# Patient Record
Sex: Female | Born: 1990 | Race: White | Hispanic: No | Marital: Single | Smoking: Current some day smoker
Health system: Southern US, Community
[De-identification: ages and names within clinical notes are randomized; demographics above are authoritative.]

## PROBLEM LIST (undated history)

## (undated) HISTORY — PX: DENTAL SURGERY: SHX609

---
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
## 2008-02-21 ENCOUNTER — Emergency Department (HOSPITAL_COMMUNITY): Admission: EM | Admit: 2008-02-21 | Discharge: 2008-02-21 | Payer: Self-pay | Admitting: Emergency Medicine

## 2008-03-18 ENCOUNTER — Emergency Department (HOSPITAL_COMMUNITY): Admission: EM | Admit: 2008-03-18 | Discharge: 2008-03-18 | Payer: Self-pay | Admitting: Family Medicine

## 2008-04-25 LAB — HM PAP SMEAR: HM Pap smear: NORMAL

## 2008-10-07 ENCOUNTER — Inpatient Hospital Stay (HOSPITAL_COMMUNITY): Admission: AD | Admit: 2008-10-07 | Discharge: 2008-10-07 | Payer: Self-pay | Admitting: Obstetrics and Gynecology

## 2009-01-26 ENCOUNTER — Inpatient Hospital Stay (HOSPITAL_COMMUNITY): Admission: AD | Admit: 2009-01-26 | Discharge: 2009-01-29 | Payer: Self-pay | Admitting: Obstetrics and Gynecology

## 2009-01-27 ENCOUNTER — Encounter (INDEPENDENT_AMBULATORY_CARE_PROVIDER_SITE_OTHER): Payer: Self-pay | Admitting: Obstetrics and Gynecology

## 2009-02-24 ENCOUNTER — Emergency Department (HOSPITAL_COMMUNITY): Admission: EM | Admit: 2009-02-24 | Discharge: 2009-02-25 | Payer: Self-pay | Admitting: Emergency Medicine

## 2009-10-16 ENCOUNTER — Emergency Department (HOSPITAL_COMMUNITY): Admission: EM | Admit: 2009-10-16 | Discharge: 2009-10-17 | Payer: Self-pay | Admitting: Emergency Medicine

## 2009-11-19 ENCOUNTER — Emergency Department (HOSPITAL_COMMUNITY): Admission: EM | Admit: 2009-11-19 | Discharge: 2009-11-20 | Payer: Self-pay | Admitting: Emergency Medicine

## 2009-11-20 ENCOUNTER — Emergency Department (HOSPITAL_COMMUNITY): Admission: EM | Admit: 2009-11-20 | Discharge: 2009-11-20 | Payer: Self-pay | Admitting: Emergency Medicine

## 2010-09-16 LAB — POCT URINALYSIS DIP (DEVICE)
Hgb urine dipstick: NEGATIVE
Ketones, ur: NEGATIVE mg/dL
Nitrite: NEGATIVE
Specific Gravity, Urine: 1.025 (ref 1.005–1.030)

## 2010-10-05 LAB — URINALYSIS, ROUTINE W REFLEX MICROSCOPIC
Bilirubin Urine: NEGATIVE
Ketones, ur: NEGATIVE mg/dL
Specific Gravity, Urine: 1.006 (ref 1.005–1.030)

## 2010-10-05 LAB — URINE MICROSCOPIC-ADD ON

## 2010-10-05 LAB — CBC
MCV: 93.4 fL (ref 78.0–100.0)
RBC: 3.46 MIL/uL — ABNORMAL LOW (ref 3.87–5.11)

## 2010-10-05 LAB — URINE CULTURE

## 2010-10-06 LAB — CBC
HCT: 38.8 % (ref 36.0–46.0)
MCHC: 34 g/dL (ref 30.0–36.0)
MCV: 93.5 fL (ref 78.0–100.0)
Platelets: 170 10*3/uL (ref 150–400)
RBC: 4.16 MIL/uL (ref 3.87–5.11)
RDW: 13.7 % (ref 11.5–15.5)

## 2010-10-09 LAB — CBC
Platelets: 143 10*3/uL — ABNORMAL LOW (ref 150–400)
RBC: 3.33 MIL/uL — ABNORMAL LOW (ref 3.87–5.11)
WBC: 16.4 10*3/uL — ABNORMAL HIGH (ref 4.0–10.5)

## 2010-10-09 LAB — URINALYSIS, ROUTINE W REFLEX MICROSCOPIC
Glucose, UA: NEGATIVE mg/dL
Specific Gravity, Urine: 1.02 (ref 1.005–1.030)
pH: 6.5 (ref 5.0–8.0)

## 2010-10-09 LAB — WET PREP, GENITAL: Trich, Wet Prep: NONE SEEN

## 2010-10-23 ENCOUNTER — Ambulatory Visit (HOSPITAL_COMMUNITY): Payer: Self-pay
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
## 2010-12-18 ENCOUNTER — Encounter: Payer: Self-pay | Admitting: Family Medicine

## 2011-03-16 ENCOUNTER — Inpatient Hospital Stay (INDEPENDENT_AMBULATORY_CARE_PROVIDER_SITE_OTHER)
Admission: RE | Admit: 2011-03-16 | Discharge: 2011-03-16 | Disposition: A | Discharge status: Home / Self Care | Payer: Medicaid Other | Source: Ambulatory Visit | Attending: Family Medicine | Admitting: Family Medicine

## 2011-03-16 DIAGNOSIS — J310 Chronic rhinitis: Secondary | ICD-10-CM

## 2011-03-16 DIAGNOSIS — J019 Acute sinusitis, unspecified: Secondary | ICD-10-CM

## 2011-03-26 LAB — POCT URINALYSIS DIP (DEVICE)
Bilirubin Urine: NEGATIVE
Glucose, UA: NEGATIVE
Ketones, ur: NEGATIVE
Operator id: 239701
Protein, ur: NEGATIVE
pH: 7

## 2011-03-26 LAB — POCT PREGNANCY, URINE
Operator id: 247071
Preg Test, Ur: NEGATIVE

## 2011-03-26 LAB — URINE CULTURE

## 2011-03-31 LAB — POCT URINALYSIS DIP (DEVICE)
Bilirubin Urine: NEGATIVE
Hgb urine dipstick: NEGATIVE
Ketones, ur: NEGATIVE
Protein, ur: NEGATIVE
Urobilinogen, UA: 1
pH: 6.5

## 2011-03-31 LAB — POCT PREGNANCY, URINE: Preg Test, Ur: NEGATIVE

## 2012-08-17 ENCOUNTER — Encounter (HOSPITAL_COMMUNITY): Payer: Self-pay

## 2012-08-17 ENCOUNTER — Inpatient Hospital Stay (HOSPITAL_COMMUNITY): Payer: Medicaid Other

## 2012-08-17 ENCOUNTER — Inpatient Hospital Stay (HOSPITAL_COMMUNITY)
Admission: AD | Admit: 2012-08-17 | Discharge: 2012-08-17 | Disposition: A | Discharge status: Home / Self Care | Payer: Medicaid Other | Source: Ambulatory Visit | Attending: Obstetrics and Gynecology | Admitting: Obstetrics and Gynecology

## 2012-08-17 DIAGNOSIS — N938 Other specified abnormal uterine and vaginal bleeding: Secondary | ICD-10-CM

## 2012-08-17 DIAGNOSIS — N949 Unspecified condition associated with female genital organs and menstrual cycle: Secondary | ICD-10-CM | POA: Insufficient documentation

## 2012-08-17 DIAGNOSIS — N739 Female pelvic inflammatory disease, unspecified: Secondary | ICD-10-CM | POA: Insufficient documentation

## 2012-08-17 DIAGNOSIS — R42 Dizziness and giddiness: Secondary | ICD-10-CM | POA: Insufficient documentation

## 2012-08-17 LAB — URINALYSIS, ROUTINE W REFLEX MICROSCOPIC
Bilirubin Urine: NEGATIVE
Urobilinogen, UA: 0.2 mg/dL (ref 0.0–1.0)

## 2012-08-17 LAB — URINE MICROSCOPIC-ADD ON

## 2012-08-17 LAB — CBC
Hemoglobin: 13.5 g/dL (ref 12.0–15.0)
MCH: 29.9 pg (ref 26.0–34.0)
WBC: 10.8 10*3/uL — ABNORMAL HIGH (ref 4.0–10.5)

## 2012-08-17 MED ORDER — OXYCODONE-ACETAMINOPHEN 5-325 MG PO TABS
1.0000 | ORAL_TABLET | ORAL | Status: DC | PRN
Start: 2012-08-17 — End: 2012-10-05

## 2012-08-17 MED ORDER — DOXYCYCLINE HYCLATE 50 MG PO CAPS: 50.0000 mg | ORAL_CAPSULE | Freq: Two times a day (BID) | ORAL | Status: DC

## 2012-08-17 MED ORDER — SODIUM CHLORIDE 0.9 % IV SOLN
INTRAVENOUS | Status: DC
  Administered 2012-08-17: 14:00:00 via INTRAVENOUS

## 2012-08-17 MED ORDER — NALBUPHINE SYRINGE 5 MG/0.5 ML
5.0000 mg | INJECTION | Freq: Once | INTRAMUSCULAR | Status: AC
  Administered 2012-08-17: 5 mg via INTRAVENOUS
  Filled 2012-08-17: qty 0.5

## 2012-08-17 MED ORDER — MEGESTROL ACETATE 40 MG PO TABS: 40.0000 mg | ORAL_TABLET | Freq: Every day | ORAL | Status: DC

## 2012-08-17 MED ORDER — OXYCODONE-ACETAMINOPHEN 5-325 MG PO TABS: 1.0000 | ORAL_TABLET | ORAL | Status: DC | PRN

## 2012-08-17 MED ORDER — LACTATED RINGERS IV SOLN
Freq: Once | INTRAVENOUS | Status: AC
  Administered 2012-08-17: 11:00:00 via INTRAVENOUS

## 2012-08-17 MED ORDER — KETOROLAC TROMETHAMINE 30 MG/ML IJ SOLN
30.0000 mg | Freq: Once | INTRAMUSCULAR | Status: AC
  Administered 2012-08-17: 30 mg via INTRAVENOUS
  Filled 2012-08-17: qty 1

## 2012-08-17 MED ORDER — DEXTROSE 5 % IV SOLN
1.0000 g | Freq: Once | INTRAVENOUS | Status: AC
  Administered 2012-08-17: 1 g via INTRAVENOUS
  Filled 2012-08-17: qty 10

## 2012-08-17 NOTE — MAU Note (Signed)
Patient is in with c/o 4 weeks of intermittent heavier to small amount of vaginal bleeding, fatigue and dizziness. She states that she was seen at [facility] and was notified yesterday that all her lab (thyroid, blood count) was normal. No bleeding observed now, patient had tampon on her way here, she was instructed to remove it, pad given. Patient also c/o generalized abdominal/body pain.

## 2012-08-17 NOTE — MAU Provider Note (Signed)
History    CSN: [id number]  Arrival date and time: 08/17/12 0904   None    Chief Complaint  Patient presents with  . Vaginal Bleeding  . Abdominal Pain   HPI Meghan Guerrero is a 22 yo G1P1 female who presents to the MAU complaining of vaginal bleeding x 4 weeks. She reports that the bleeding started 4 weeks ago, and that the first 2 weeks she was bleeding very heavily every day, using a tampon every 1-2 hours. She reports that during these first 2 weeks she considered that she may be having a miscarriage because in previous weeks she had been sexually active and had been nauseous. She never took a pregnancy test. She reports that she decided that she was probably not having a miscarriage, and instead decided the bleeding was due to stress. She continued to bleed for another 2 weeks, although the bleeding lightened and was only occuring off and on. She describes that she has been taking OCPs for over a year and had regular periods every month that would last 3-4 days and were not very heavy. She reports her last regular period was at the beginning of January. She confirms that she has continued to take her OCPs regularly over this 4 week period of bleeding.  She also reports feeling very achy and weak during these 4 weeks. She also complains of sharp abdominal pains x 3 years that has worsened with this episode of bleeding. She has had ortho and neuro workup for this pain. She reports her GYN has not investigated her abdominal pain. She reports that she has been eating well and has been drinking a lot of [facility] to stay hydrated. She denies drinking much water over the last 4 weeks. She denies fever, vomiting, or pain with urination.   On Saturday she went to her doctor the patient reports that she was told her blood count was fine and was prescribed 800 mg ibuprofen. She reports mild relief of her pain with the ibuprofen. Patient is a current smoker and smokes .5 packs/day x 3 years.  OB  History   Grav Para Term Preterm Abortions TAB SAB Ect Mult Living   1 1 1       1       Past Medical History  Diagnosis Date  . Migraine     History reviewed. No pertinent past surgical history.  History reviewed. No pertinent family history.  History  Substance Use Topics  . Smoking status: Current Some Day Smoker    Types: Cigarettes  . Smokeless tobacco: Not on file  . Alcohol Use: Yes     Comment: occasional per patient    Allergies:  Allergies  Allergen Reactions  . Ketek (Telithromycin) Hives  . Tramadol Nausea And Vomiting    Prescriptions prior to admission  Medication Sig Dispense Refill  . drospirenone-ethinyl estradiol (VESTURA) 3-0.02 MG tablet Take 1 tablet by mouth daily.      Marland Kitchen ibuprofen (ADVIL,MOTRIN) 800 MG tablet Take 800 mg by mouth every 8 (eight) hours as needed for pain.        Review of Systems  Constitutional: Positive for malaise/fatigue. Negative for fever and chills.  Respiratory: Negative for cough and wheezing.   Cardiovascular: Negative for chest pain, palpitations and leg swelling.  Gastrointestinal: Positive for nausea and abdominal pain. Negative for vomiting, diarrhea, constipation and blood in stool.  Genitourinary: Negative for dysuria, urgency and frequency.  Musculoskeletal: Positive for back pain.  Neurological: Positive for dizziness,  weakness and headaches. Negative for loss of consciousness.    Physical Exam   Blood pressure 112/76, pulse 102, temperature 97.8 F (36.6 C), temperature source Oral, resp. rate 20, last menstrual period 07/01/2012, SpO2 100.00%.  Physical Exam Physical Examination:  General appearance - oriented to person, place, and time and in mild to moderate distress Mouth - mucous membranes moist, pharynx normal without lesions Chest - clear to auscultation, no wheezes, rales or rhonchi, symmetric air entry Heart - normal rate, regular rhythm, normal S1, S2, no murmurs, rubs, clicks or  gallops Abdomen - soft, mild pelvic tenderness, no guarding or rebound tenderness, nondistended, no masses or organomegaly Bimanual Pelvic- mild cervical motion tenderness noted on exam Extremities - peripheral pulses normal, no pedal edema, no clubbing or cyanosis Skin - normal coloration and turgor, no rashes, no suspicious skin lesions noted  MAU Course  Procedures  Results for orders placed during the hospital encounter of 08/17/12 (from the past 24 hour(s))  URINALYSIS, ROUTINE W REFLEX MICROSCOPIC     Status: Abnormal   Collection Time    08/17/12  9:10 AM      Result Value Range   Color, Urine AMBER (*) YELLOW   APPearance HAZY (*) CLEAR   Specific Gravity, Urine >1.030 (*) 1.005 - 1.030   pH 6.0  5.0 - 8.0   Glucose, UA NEGATIVE  NEGATIVE mg/dL   Hgb urine dipstick TRACE (*) NEGATIVE   Bilirubin Urine NEGATIVE  NEGATIVE   Ketones, ur 15 (*) NEGATIVE mg/dL   Protein, ur NEGATIVE  NEGATIVE mg/dL   Urobilinogen, UA 0.2  0.0 - 1.0 mg/dL   Nitrite NEGATIVE  NEGATIVE   Leukocytes, UA NEGATIVE  NEGATIVE  URINE MICROSCOPIC-ADD ON     Status: Abnormal   Collection Time    08/17/12  9:10 AM      Result Value Range   Squamous Epithelial / LPF MANY (*) RARE   WBC, UA 3-6  <3 WBC/hpf   RBC / HPF 0-2  <3 RBC/hpf   Bacteria, UA FEW (*) RARE   Urine-Other MUCOUS PRESENT    POCT PREGNANCY, URINE     Status: None   Collection Time    08/17/12  9:18 AM      Result Value Range   Preg Test, Ur NEGATIVE  NEGATIVE  CBC     Status: Abnormal   Collection Time    08/17/12  9:35 AM      Result Value Range   WBC 10.8 (*) 4.0 - 10.5 K/uL   RBC 4.52  3.87 - 5.11 MIL/uL   Hemoglobin 13.5  12.0 - 15.0 g/dL   HCT 45.4  09.8 - 11.9 %   MCV 90.5  78.0 - 100.0 fL   MCH 29.9  26.0 - 34.0 pg   MCHC 33.0  30.0 - 36.0 g/dL   RDW 14.7  82.9 - 56.2 %   Platelets 156  150 - 400 K/uL    Assessment and Plan  Assessment: Meghan Guerrero is a 22 yo female who presents with vaginal bleeding and  dizziness. Her Hgb is 13.5 and Hct 40.9 today. Minimal blood loss this morning. Urinalysis shows specific gravity of >1.030 and is positive for ketones. Patient is most likely dehydrated.  Plan:   Adela Glimpse 08/17/2012, 10:01 AM   Has not mentioned anything to me about pain until just now. Now states she has had sharp needle-like pain in her pelvis and lower back since the birth of her child. Was  sent to Ortho and Neuro by her doctor in [facility].    Ultrasound normal with 2mm endometrium. Normal ovaries Discussed with Dr Jolayne Panther. 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  WIll give 2 weeks of Megace to stabilize endometrium.  Patient requests something for pain at home.  Will give limited Rx vicodin with no refills. ?chronic pain Will refer to clinic for long term followup .

## 2012-08-17 NOTE — MAU Note (Signed)
Patient states she has had bleeding for about 4 weeks, sometime heavy then light. States she thinks she has had a miscarriage. Went to [facility] on Saturday and was evaluated and states the pregnancy test was negative. Having abdominal pain

## 2012-08-18 LAB — URINE CULTURE: Colony Count: 35000

## 2012-08-19 NOTE — MAU Provider Note (Signed)
Attestation of Attending Supervision of Advanced Practitioner (CNM/NP): Evaluation and management procedures were performed by the Advanced Practitioner under my supervision and collaboration.  I have reviewed the Advanced Practitioner's note and chart, and I agree with the management and plan.  Corneilus Heggie 08/19/2012 12:15 PM   

## 2012-10-05 ENCOUNTER — Emergency Department (INDEPENDENT_AMBULATORY_CARE_PROVIDER_SITE_OTHER)
Admission: EM | Admit: 2012-10-05 | Discharge: 2012-10-05 | Disposition: A | Discharge status: Home / Self Care | Payer: Self-pay | Source: Home / Self Care | Attending: Emergency Medicine | Admitting: Emergency Medicine

## 2012-10-05 ENCOUNTER — Encounter (HOSPITAL_COMMUNITY): Payer: Self-pay | Admitting: Emergency Medicine

## 2012-10-05 DIAGNOSIS — R05 Cough: Secondary | ICD-10-CM

## 2012-10-05 DIAGNOSIS — J029 Acute pharyngitis, unspecified: Secondary | ICD-10-CM

## 2012-10-05 DIAGNOSIS — J069 Acute upper respiratory infection, unspecified: Secondary | ICD-10-CM

## 2012-10-05 MED ORDER — GUAIFENESIN-CODEINE 100-10 MG/5ML PO SOLN: 5.0000 mL | Freq: Three times a day (TID) | ORAL | Status: DC | PRN

## 2012-10-05 MED ORDER — ALBUTEROL SULFATE HFA 108 (90 BASE) MCG/ACT IN AERS: 1.0000 | INHALATION_SPRAY | Freq: Four times a day (QID) | RESPIRATORY_TRACT | Status: DC | PRN

## 2012-10-05 MED ORDER — PREDNISONE 10 MG PO TABS: 20.0000 mg | ORAL_TABLET | Freq: Every day | ORAL | Status: AC

## 2012-10-05 NOTE — ED Provider Notes (Signed)
History     CSN: [id number]  Arrival date & time 10/05/12  1225   First MD Initiated Contact with Patient 10/05/12 1400      Chief Complaint  Patient presents with  . URI    (Consider location/radiation/quality/duration/timing/severity/associated sxs/prior treatment) HPI Comments: Patient presents urgent care describing she's been coughing having sore throat and runny nose and generalized fatigue and body aches since Sunday night. She is taking her son to the emergency department Saturday was diagnosed with streptococcal infection. She's also nauseous and had vomited a couple times. Palpable fevers have been taking ibuprofen and sinus cold medicine with no relief. Mainly describing nocturnal cough does not letter sleep and some mild wheezing.  Patient is a 22 y.o. female presenting with URI. The history is provided by the patient.  URI Presenting symptoms: congestion, cough, fever, rhinorrhea and sore throat   Presenting symptoms: no fatigue   Severity:  Moderate Onset quality:  Gradual Duration:  3 days Timing:  Constant Progression:  Worsening Chronicity:  New Relieved by:  Nothing Worsened by:  Nothing tried Associated symptoms: headaches, swollen glands and wheezing   Associated symptoms: no myalgias and no neck pain   Risk factors: sick contacts   Risk factors: no diabetes mellitus     Past Medical History  Diagnosis Date  . Migraine     History reviewed. No pertinent past surgical history.  No family history on file.  History  Substance Use Topics  . Smoking status: Current Some Day Smoker    Types: Cigarettes  . Smokeless tobacco: Not on file  . Alcohol Use: Yes     Comment: occasional per patient    OB History   Grav Para Term Preterm Abortions TAB SAB Ect Mult Living   1 1 1       1       Review of Systems  Constitutional: Positive for fever and appetite change. Negative for chills, diaphoresis and fatigue.  HENT: Positive for congestion, sore  throat and rhinorrhea. Negative for neck pain, neck stiffness and postnasal drip.   Respiratory: Positive for cough and wheezing. Negative for shortness of breath.   Musculoskeletal: Negative for myalgias and back pain.  Skin: Negative for color change.  Neurological: Positive for headaches.    Allergies  Ketek and Tramadol  Home Medications   Current Outpatient Rx  Name  Route  Sig  Dispense  Refill  . ibuprofen (ADVIL,MOTRIN) 800 MG tablet   Oral   Take 800 mg by mouth every 8 (eight) hours as needed for pain.         Marland Kitchen norethindrone-ethinyl estradiol (MICROGESTIN,JUNEL,LOESTRIN) 1-20 MG-MCG tablet   Oral   Take 1 tablet by mouth daily.           BP 119/76  Pulse 83  Temp(Src) 98.4 F (36.9 C) (Oral)  Resp 13  SpO2 100%  LMP 09/04/2012  Physical Exam  Nursing note and vitals reviewed. Constitutional: She appears well-developed and well-nourished.  Non-toxic appearance. She does not have a sickly appearance. She does not appear ill. No distress.  HENT:  Head: Normocephalic.  Right Ear: Tympanic membrane normal.  Left Ear: Tympanic membrane normal.  Nose: Rhinorrhea present. No mucosal edema, nose lacerations, sinus tenderness or nasal deformity.  Mouth/Throat: Uvula is midline. Mucous membranes are not pale, not dry and not cyanotic. Posterior oropharyngeal erythema present. No oropharyngeal exudate, posterior oropharyngeal edema or tonsillar abscesses.  Eyes: Conjunctivae are normal. Right eye exhibits no discharge. Left eye exhibits no  discharge. No scleral icterus.  Neck: Neck supple. No JVD present.  Cardiovascular: Normal rate.  Exam reveals no gallop and no friction rub.   No murmur heard. Pulmonary/Chest: Effort normal. She has no decreased breath sounds. She has no wheezes. She has no rhonchi. She has no rales. She exhibits no tenderness.  Abdominal: Soft.  Lymphadenopathy:    She has cervical adenopathy.  Neurological: She is alert.  Skin: No rash  noted. No erythema.    ED Course  Procedures (including critical care time)  Labs Reviewed  POCT RAPID STREP A ([facility] URG CARE ONLY)   No results found.   No diagnosis found.    MDM  Symptoms and exam were consistent with a viral respiratory process. Will prescribed with patient and antitussive 5 days prednisone and albuterol and she's having a mild reactive airway disease.patient instructed to return if any worsening symptoms or no improvement of her second respiratory exam. Patient agrees with treatment plan and followup care as necessary.      Jimmie Molly, MD 10/05/12 [phone]

## 2012-10-05 NOTE — ED Notes (Signed)
Pt c/o fever, coughing, sore throat, runny nose, and generalized weakness since Sunday night. Son was treated for strept throat Saturday in ED. Pt reports emesis since Sunday night last reported emesis this morning was yellow. Fever has been 100 x 2 days. Has tried taking Ibuprofen 800 mg and sinus cold and cough with no relief. Patient is alert and oriented.

## 2012-10-05 NOTE — ED Notes (Signed)
Xray student obtained vitals 

## 2012-10-06 ENCOUNTER — Telehealth (HOSPITAL_COMMUNITY): Payer: Self-pay | Admitting: *Deleted

## 2012-10-06 NOTE — ED Notes (Signed)
Pt. called on VM @ 1747 and said she was seen here yesterday by Dr. Ladon Applebaum and was given cough medicine.  She said she only has 2 doses of cough medicine left. States its not really helping her that much.  I called but her VM box is full and unable to leave a message. I left our call back number. Vassie Moselle 10/06/2012

## 2012-12-26 ENCOUNTER — Emergency Department (INDEPENDENT_AMBULATORY_CARE_PROVIDER_SITE_OTHER)
Admission: EM | Admit: 2012-12-26 | Discharge: 2012-12-26 | Disposition: A | Discharge status: Home / Self Care | Payer: Medicaid Other | Source: Home / Self Care | Attending: Emergency Medicine | Admitting: Emergency Medicine

## 2012-12-26 ENCOUNTER — Encounter (HOSPITAL_COMMUNITY): Payer: Self-pay | Admitting: Emergency Medicine

## 2012-12-26 DIAGNOSIS — R82998 Other abnormal findings in urine: Secondary | ICD-10-CM

## 2012-12-26 DIAGNOSIS — R110 Nausea: Secondary | ICD-10-CM

## 2012-12-26 DIAGNOSIS — R8271 Bacteriuria: Secondary | ICD-10-CM

## 2012-12-26 LAB — POCT I-STAT, CHEM 8
Calcium, Ion: 1.21 mmol/L (ref 1.12–1.23)
Chloride: 104 mEq/L (ref 96–112)
Glucose, Bld: 120 mg/dL — ABNORMAL HIGH (ref 70–99)
HCT: 48 % — ABNORMAL HIGH (ref 36.0–46.0)
TCO2: 24 mmol/L (ref 0–100)

## 2012-12-26 LAB — POCT URINALYSIS DIP (DEVICE)
Bilirubin Urine: NEGATIVE
Ketones, ur: NEGATIVE mg/dL
Specific Gravity, Urine: 1.015 (ref 1.005–1.030)
pH: 6 (ref 5.0–8.0)

## 2012-12-26 MED ORDER — ONDANSETRON 4 MG PO TBDP: 4.0000 mg | ORAL_TABLET | Freq: Four times a day (QID) | ORAL | Status: DC | PRN

## 2012-12-26 NOTE — ED Notes (Signed)
Pt c/o vomiting and diarrhea yesterday morning. Getting a little better but still having nausea with smell of food.  Having sinus pressure and pain. " feels like head is going to explode"  C/o dark color to urine. Denies any other urinary problems.

## 2012-12-26 NOTE — ED Provider Notes (Signed)
History    CSN: [id number] Arrival date & time 12/26/12  1311  First MD Initiated Contact with Patient 12/26/12 1504     Chief Complaint  Patient presents with  . Emesis    vomiting and diarrhea yesterday morning.   Marland Kitchen Urinary Tract Infection    dark urine   (Consider location/radiation/quality/duration/timing/severity/associated sxs/prior Treatment) HPI Comments: A 22 year old female presents complaining of nausea, vomiting, diarrhea began on Friday. Her son recently recovered from gastroenteritis and she believe she may have caught this from him. She also noted that yesterday she started to have very dark urine. The vomiting has since resolved but she still has extreme nausea that is worse with smelling any food. She does note that she very rarely drinks any water and she thinks this may have given her a urinary tract infection. She endorses the dark urine and lower back pain only. She denies hematuria, dysuria, or urinary frequency. Denies pain in her flanks. Currently, her 2 complaints are dark urine and nausea.  Patient is a 22 y.o. female presenting with vomiting and urinary tract infection.  Emesis Associated symptoms: diarrhea   Associated symptoms: no abdominal pain, no arthralgias, no chills and no myalgias   Urinary Tract Infection Pertinent negatives include no chest pain, no abdominal pain and no shortness of breath.   Past Medical History  Diagnosis Date  . Migraine    History reviewed. No pertinent past surgical history. History reviewed. No pertinent family history. History  Substance Use Topics  . Smoking status: Current Some Day Smoker    Types: Cigarettes  . Smokeless tobacco: Not on file  . Alcohol Use: Yes     Comment: occasional per patient   OB History   Grav Para Term Preterm Abortions TAB SAB Ect Mult Living   1 1 1       1      Review of Systems  Constitutional: Negative for fever and chills.  Eyes: Negative for visual disturbance.  Respiratory:  Negative for cough and shortness of breath.   Cardiovascular: Negative for chest pain, palpitations and leg swelling.  Gastrointestinal: Positive for nausea, vomiting and diarrhea. Negative for abdominal pain.  Endocrine: Negative for polydipsia and polyuria.  Genitourinary: Negative for dysuria, urgency, frequency, hematuria, flank pain, decreased urine volume, vaginal discharge, difficulty urinating and dyspareunia.       Dark urine  Musculoskeletal: Negative for myalgias and arthralgias.  Skin: Negative for rash.  Neurological: Negative for dizziness, weakness and light-headedness.    Allergies  Ketek and Tramadol  Home Medications   Current Outpatient Rx  Name  Route  Sig  Dispense  Refill  . albuterol (PROVENTIL HFA;VENTOLIN HFA) 108 (90 BASE) MCG/ACT inhaler   Inhalation   Inhale 1-2 puffs into the lungs every 6 (six) hours as needed for wheezing or shortness of breath.   1 Inhaler   0   . guaiFENesin-codeine 100-10 MG/5ML syrup   Oral   Take 5 mLs by mouth 3 (three) times daily as needed for cough.   120 mL   0   . ibuprofen (ADVIL,MOTRIN) 800 MG tablet   Oral   Take 800 mg by mouth every 8 (eight) hours as needed for pain.         Marland Kitchen norethindrone-ethinyl estradiol (MICROGESTIN,JUNEL,LOESTRIN) 1-20 MG-MCG tablet   Oral   Take 1 tablet by mouth daily.         . ondansetron (ZOFRAN-ODT) 4 MG disintegrating tablet   Oral   Take 1 tablet (  4 mg total) by mouth every 6 (six) hours as needed for nausea. PRN for nausea or vomiting   12 tablet   0    BP 127/74  Pulse 76  Temp(Src) 98.3 F (36.8 C) (Oral)  Resp 18  SpO2 100%  LMP 11/15/2012 Physical Exam  Nursing note and vitals reviewed. Constitutional: She is oriented to person, place, and time. Vital signs are normal. She appears well-developed and well-nourished. No distress.  HENT:  Head: Atraumatic.  Eyes: EOM are normal. Pupils are equal, round, and reactive to light.  Cardiovascular: Normal rate,  regular rhythm and normal heart sounds.  Exam reveals no gallop and no friction rub.   No murmur heard. Pulmonary/Chest: Effort normal and breath sounds normal. No respiratory distress. She has no wheezes. She has no rales.  Abdominal: Soft. There is tenderness in the left lower quadrant. There is no CVA tenderness.  She says the left lower quadrant abdominal pain is chronic  Neurological: She is alert and oriented to person, place, and time. She has normal strength.  Skin: Skin is warm and dry. She is not diaphoretic.  Psychiatric: She has a normal mood and affect. Her behavior is normal. Judgment normal.    ED Course  Procedures (including critical care time) Labs Reviewed  POCT URINALYSIS DIP (DEVICE) - Abnormal; Notable for the following:    Hgb urine dipstick SMALL (*)    Nitrite POSITIVE (*)    Leukocytes, UA SMALL (*)    All other components within normal limits  POCT I-STAT, CHEM 8 - Abnormal; Notable for the following:    BUN 4 (*)    Glucose, Bld 120 (*)    Hemoglobin 16.3 (*)    HCT 48.0 (*)    All other components within normal limits  URINE CULTURE  POCT PREGNANCY, URINE   No results found. 1. Nausea   2. Bacteriuria     MDM  The dark urine is probably from getting dehydrated and viral gastroenteritis, not from a urinary tract infection. There was a small amount of leukocyte esterase in the urine as well as a small amount of blood, but her symptoms do not match up with a urinary tract infection. Will treat her nausea and have her increase by mouth fluid intake well we send the urine culture. If the culture comes back positive, will treat as a UTI.   Meds ordered this encounter  Medications  . ondansetron (ZOFRAN-ODT) 4 MG disintegrating tablet    Sig: Take 1 tablet (4 mg total) by mouth every 6 (six) hours as needed for nausea. PRN for nausea or vomiting    Dispense:  12 tablet    Refill:  0     Graylon Good, PA-C 12/26/12 1603

## 2012-12-27 NOTE — ED Provider Notes (Signed)
Medical screening examination/treatment/procedure(s) were performed by non-physician practitioner and as supervising physician I was immediately available for consultation/collaboration.   MORENO-COLL,Traniya Prichett; MD  Linzy Laury Moreno-Coll, MD 12/27/12 0813 

## 2012-12-28 ENCOUNTER — Telehealth (HOSPITAL_COMMUNITY): Payer: Self-pay | Admitting: *Deleted

## 2012-12-28 ENCOUNTER — Telehealth (HOSPITAL_COMMUNITY): Payer: Self-pay | Admitting: Emergency Medicine

## 2012-12-28 LAB — URINE CULTURE: Colony Count: >=100000

## 2012-12-28 MED ORDER — CEPHALEXIN 500 MG PO CAPS: 500.0000 mg | ORAL_CAPSULE | Freq: Three times a day (TID) | ORAL | Status: DC

## 2012-12-28 NOTE — ED Notes (Signed)
Her urine culture grew out greater than 100,000 colonies of Escherichia coli. She was not treated with an antibiotic. Will send a prescription into her pharmacy for cephalexin 500 mg 3 times a day for 10 days.  Reuben Likes, MD 12/28/12 [phone]

## 2012-12-28 NOTE — Telephone Encounter (Signed)
Message copied by Reuben Likes on Tue Dec 28, 2012  6:44 PM ------      Message from: Vassie Moselle      Created: Tue Dec 28, 2012  3:45 PM      Regarding: lab       Urine culture: >100,000 colonies E. Coli.  I do not see an order for antibiotics. Pt. of Almedia Balls PA.      Vassie Moselle      12/28/2012       ------

## 2012-12-28 NOTE — ED Notes (Signed)
Urine culture: > 100,000 colonies E. Coli.  Order of Keflex e prescribed by Dr. Lorenz Coaster to pt.'s pharmacy. I called pt.  Pt. verified x 2 and given results. Pt. told she needs Keflex and where to pick up her Rx. Pt. instructed to drink plenty of water. Pt. voiced understanding. Vassie Moselle 12/28/2012

## 2013-01-02 MED ORDER — SULFAMETHOXAZOLE-TRIMETHOPRIM 800-160 MG PO TABS: 1.0000 | ORAL_TABLET | Freq: Two times a day (BID) | ORAL | Status: DC

## 2013-04-05 ENCOUNTER — Encounter (HOSPITAL_BASED_OUTPATIENT_CLINIC_OR_DEPARTMENT_OTHER): Payer: Self-pay | Admitting: *Deleted

## 2013-04-05 ENCOUNTER — Emergency Department (HOSPITAL_BASED_OUTPATIENT_CLINIC_OR_DEPARTMENT_OTHER)
Admission: EM | Admit: 2013-04-05 | Discharge: 2013-04-05 | Disposition: A | Discharge status: Home / Self Care | Payer: Medicaid Other | Attending: Emergency Medicine | Admitting: Emergency Medicine

## 2013-04-05 DIAGNOSIS — M545 Low back pain, unspecified: Secondary | ICD-10-CM

## 2013-04-05 DIAGNOSIS — R52 Pain, unspecified: Secondary | ICD-10-CM | POA: Insufficient documentation

## 2013-04-05 DIAGNOSIS — Z3202 Encounter for pregnancy test, result negative: Secondary | ICD-10-CM | POA: Insufficient documentation

## 2013-04-05 DIAGNOSIS — N390 Urinary tract infection, site not specified: Secondary | ICD-10-CM

## 2013-04-05 DIAGNOSIS — G43909 Migraine, unspecified, not intractable, without status migrainosus: Secondary | ICD-10-CM | POA: Insufficient documentation

## 2013-04-05 DIAGNOSIS — F17200 Nicotine dependence, unspecified, uncomplicated: Secondary | ICD-10-CM | POA: Insufficient documentation

## 2013-04-05 DIAGNOSIS — G8929 Other chronic pain: Secondary | ICD-10-CM | POA: Insufficient documentation

## 2013-04-05 DIAGNOSIS — Z79899 Other long term (current) drug therapy: Secondary | ICD-10-CM | POA: Insufficient documentation

## 2013-04-05 LAB — URINALYSIS, ROUTINE W REFLEX MICROSCOPIC
Nitrite: NEGATIVE
Protein, ur: NEGATIVE mg/dL
Specific Gravity, Urine: 1.023 (ref 1.005–1.030)
Urobilinogen, UA: 4 mg/dL — ABNORMAL HIGH (ref 0.0–1.0)

## 2013-04-05 LAB — URINE MICROSCOPIC-ADD ON

## 2013-04-05 MED ORDER — MELOXICAM 15 MG PO TABS
15.0000 mg | ORAL_TABLET | Freq: Every day | ORAL | Status: DC
Start: 2013-04-05 — End: 2013-06-10

## 2013-04-05 MED ORDER — CEPHALEXIN 500 MG PO CAPS
500.0000 mg | ORAL_CAPSULE | Freq: Three times a day (TID) | ORAL | Status: DC
Start: 2013-04-05 — End: 2013-06-10

## 2013-04-05 NOTE — ED Provider Notes (Signed)
CSN: [id number]     Arrival date & time 04/05/13  1516 History   First MD Initiated Contact with Patient 04/05/13 1526     Chief Complaint  Patient presents with  . Back Pain   (Consider location/radiation/quality/duration/timing/severity/associated sxs/prior Treatment) HPI  Patient presents with multiple c/o. Specifically she c/o of fatigue, acute on chronic back pain that radiates down the left leg. She states that she is so tired she "collapses to the floor." Denies fevers, chills, fatigue, night sweats, unexplained weight loss. She is complaining of her family is demanding too much of her because she has to drive both her father and son to school and clean her house. Patient was discharged from her PCP for failure to pay her bill and can no longer see her provider. Denies DOE, SOB, chest tightness or pressure, radiation to left arm, jaw or back, or diaphoresis. Denies dysuria, flank pain, suprapubic pain, frequency, urgency, or hematuria. Denies headaches, light headedness, weakness, visual disturbances. Denies abdominal pain, nausea, vomiting, diarrhea or constipation.    Past Medical History  Diagnosis Date  . Migraine    History reviewed. No pertinent past surgical history. History reviewed. No pertinent family history. History  Substance Use Topics  . Smoking status: Current Some Day Smoker    Types: Cigarettes  . Smokeless tobacco: Not on file  . Alcohol Use: Yes     Comment: occasional per patient   OB History   Grav Para Term Preterm Abortions TAB SAB Ect Mult Living   1 1 1       1      Review of Systems  Ten systems reviewed and are negative for acute change, except as noted in the HPI.   Allergies  Ketek and Tramadol  Home Medications   Current Outpatient Rx  Name  Route  Sig  Dispense  Refill  . clonazePAM (KLONOPIN) 1 MG tablet   Oral   Take 1 mg by mouth 2 (two) times daily as needed for anxiety.         Marland Kitchen albuterol (PROVENTIL HFA;VENTOLIN HFA) 108  (90 BASE) MCG/ACT inhaler   Inhalation   Inhale 1-2 puffs into the lungs every 6 (six) hours as needed for wheezing or shortness of breath.   1 Inhaler   0   . ibuprofen (ADVIL,MOTRIN) 800 MG tablet   Oral   Take 800 mg by mouth every 8 (eight) hours as needed for pain.         Marland Kitchen norethindrone-ethinyl estradiol (MICROGESTIN,JUNEL,LOESTRIN) 1-20 MG-MCG tablet   Oral   Take 1 tablet by mouth daily.          BP 117/72  Pulse 65  Temp(Src) 98.5 F (36.9 C) (Oral)  Resp 16  Ht 5\' 6"  (1.676 m)  Wt 107 lb (48.535 kg)  BMI 17.28 kg/m2  SpO2 100% Physical Exam  Nursing note and vitals reviewed. Constitutional: She is oriented to person, place, and time. She appears well-developed and well-nourished. No distress.  HENT:  Head: Normocephalic and atraumatic.  Eyes: Conjunctivae are normal. No scleral icterus.  Neck: Normal range of motion.  Cardiovascular: Normal rate, regular rhythm and normal heart sounds.  Exam reveals no gallop and no friction rub.   No murmur heard. Pulmonary/Chest: Effort normal and breath sounds normal. No respiratory distress.  Abdominal: Soft. Bowel sounds are normal. She exhibits no distension and no mass. There is no tenderness. There is no guarding.  Neurological: She is alert and oriented to person, place, and time.  Positive straight leg raise on the Right. Speech is clear and goal oriented, follows commands Major Cranial nerves without deficit, no facial droop Normal strength in upper and lower extremities bilaterally including dorsiflexion and plantar flexion, strong and equal grip strength Sensation normal to light and sharp touch Moves extremities without ataxia, coordination intact Normal finger to nose and rapid alternating movements Neg romberg, no pronator drift Normal gait, heel and toe walking intact Normal heel-shin and balance   Skin: Skin is warm and dry. She is not diaphoretic.  Psychiatric:  Histrionic behavior    ED Course   Procedures (including critical care time) Labs Review Labs Reviewed  URINALYSIS, ROUTINE W REFLEX MICROSCOPIC - Abnormal; Notable for the following:    APPearance CLOUDY (*)    Bilirubin Urine SMALL (*)    Urobilinogen, UA 4.0 (*)    Leukocytes, UA MODERATE (*)    All other components within normal limits  URINE MICROSCOPIC-ADD ON - Abnormal; Notable for the following:    Squamous Epithelial / LPF FEW (*)    Bacteria, UA FEW (*)    All other components within normal limits  URINE CULTURE  PREGNANCY, URINE   Imaging Review No results found.  MDM   1. UTI (lower urinary tract infection)   2. Acute exacerbation of chronic low back pain    BP 117/72  Pulse 65  Temp(Src) 98.5 F (36.9 C) (Oral)  Resp 16  Ht 5\' 6"  (1.676 m)  Wt 107 lb (48.535 kg)  BMI 17.28 kg/m2  SpO2 100%  Patient with UTI. Back pain .  She exhibits histrionic behavior. 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 Her PCP who discharged her asked her to follow up with psychiatry per patient. Patient will be discharged with Keflex and mobic.  Follow up at community health.    Arthor Captain, PA-C 04/05/13 1709

## 2013-04-05 NOTE — ED Notes (Signed)
Pt states " u all havent done a damn  thing for me i gonna fall when i walk out of here "

## 2013-04-05 NOTE — ED Notes (Signed)
Pt c/o lower back pain  With n/v and generalized weakness

## 2013-04-05 NOTE — ED Notes (Signed)
W/c offered to pt

## 2013-04-06 NOTE — ED Provider Notes (Signed)
Medical screening examination/treatment/procedure(s) were performed by non-physician practitioner and as supervising physician I was immediately available for consultation/collaboration.   Charles B. Sheldon, MD 04/06/13 0014 

## 2013-04-07 LAB — URINE CULTURE
Colony Count: NO GROWTH
Culture: NO GROWTH

## 2013-06-10 ENCOUNTER — Emergency Department (HOSPITAL_COMMUNITY): Payer: Medicaid Other

## 2013-06-10 ENCOUNTER — Encounter (HOSPITAL_COMMUNITY): Payer: Self-pay | Admitting: Emergency Medicine

## 2013-06-10 ENCOUNTER — Emergency Department (HOSPITAL_COMMUNITY)
Admission: EM | Admit: 2013-06-10 | Discharge: 2013-06-11 | Disposition: A | Discharge status: Home / Self Care | Payer: Medicaid Other | Attending: Emergency Medicine | Admitting: Emergency Medicine

## 2013-06-10 DIAGNOSIS — R112 Nausea with vomiting, unspecified: Secondary | ICD-10-CM | POA: Insufficient documentation

## 2013-06-10 DIAGNOSIS — F17200 Nicotine dependence, unspecified, uncomplicated: Secondary | ICD-10-CM | POA: Insufficient documentation

## 2013-06-10 DIAGNOSIS — Z79899 Other long term (current) drug therapy: Secondary | ICD-10-CM | POA: Insufficient documentation

## 2013-06-10 DIAGNOSIS — R52 Pain, unspecified: Secondary | ICD-10-CM | POA: Insufficient documentation

## 2013-06-10 DIAGNOSIS — J40 Bronchitis, not specified as acute or chronic: Secondary | ICD-10-CM

## 2013-06-10 DIAGNOSIS — R197 Diarrhea, unspecified: Secondary | ICD-10-CM | POA: Insufficient documentation

## 2013-06-10 DIAGNOSIS — J209 Acute bronchitis, unspecified: Secondary | ICD-10-CM | POA: Insufficient documentation

## 2013-06-10 DIAGNOSIS — R5381 Other malaise: Secondary | ICD-10-CM | POA: Insufficient documentation

## 2013-06-10 DIAGNOSIS — G43909 Migraine, unspecified, not intractable, without status migrainosus: Secondary | ICD-10-CM | POA: Insufficient documentation

## 2013-06-10 LAB — COMPREHENSIVE METABOLIC PANEL
AST: 15 U/L (ref 0–37)
Albumin: 3.5 g/dL (ref 3.5–5.2)
Alkaline Phosphatase: 46 U/L (ref 39–117)
CO2: 26 mEq/L (ref 19–32)
Chloride: 104 mEq/L (ref 96–112)
Potassium: 4 mEq/L (ref 3.5–5.1)
Total Bilirubin: 0.1 mg/dL — ABNORMAL LOW (ref 0.3–1.2)
Total Protein: 7 g/dL (ref 6.0–8.3)

## 2013-06-10 LAB — CBC WITH DIFFERENTIAL/PLATELET
Basophils Absolute: 0 10*3/uL (ref 0.0–0.1)
Basophils Relative: 0 % (ref 0–1)
Eosinophils Absolute: 0.3 10*3/uL (ref 0.0–0.7)
Lymphocytes Relative: 35 % (ref 12–46)
MCH: 30.5 pg (ref 26.0–34.0)
MCHC: 33.3 g/dL (ref 30.0–36.0)
Monocytes Relative: 7 % (ref 3–12)
Neutro Abs: 6.8 10*3/uL (ref 1.7–7.7)
Neutrophils Relative %: 55 % (ref 43–77)
Platelets: 203 10*3/uL (ref 150–400)
RDW: 13.1 % (ref 11.5–15.5)
WBC: 12.2 10*3/uL — ABNORMAL HIGH (ref 4.0–10.5)

## 2013-06-10 NOTE — ED Notes (Signed)
The pt just returned from peds where her child was being seen.  Waiting to be seen for herself

## 2013-06-10 NOTE — ED Notes (Signed)
Pt is here with son that is in peds ED at this time.  She requests that he be seen 1st.  Pt reports non-productive cough x 1 month.  C/o nausea, vomiting, and diarrhea x 5 days with generalized body aches.  States she has vomited x 2 today.

## 2013-06-11 MED ORDER — HYDROCOD POLST-CHLORPHEN POLST 10-8 MG/5ML PO LQCR: 5.0000 mL | Freq: Two times a day (BID) | ORAL | Status: DC

## 2013-06-11 MED ORDER — ONDANSETRON 4 MG PO TBDP
8.0000 mg | ORAL_TABLET | Freq: Once | ORAL | Status: AC
  Administered 2013-06-11: 8 mg via ORAL
  Filled 2013-06-11: qty 2

## 2013-06-11 MED ORDER — GUAIFENESIN ER 600 MG PO TB12: 1200.0000 mg | ORAL_TABLET | Freq: Two times a day (BID) | ORAL | Status: DC

## 2013-06-11 MED ORDER — AZITHROMYCIN 250 MG PO TABS: ORAL_TABLET | ORAL | Status: DC

## 2013-06-11 NOTE — ED Provider Notes (Signed)
CSN: [id number]     Arrival date & time 06/10/13  2024 History   First MD Initiated Contact with Patient 06/11/13 0017     Chief Complaint  Patient presents with  . Cough  . Emesis   HPI  History provided by the patient. Patient's 22 year old female with no significant PMH presenting with multiple complaints of persistent coughing, nasal congestion, nausea, vomiting and diarrhea. Patient states she first began having upper respiratory symptoms with cough and congestion around Thanksgiving. She states there have been several illnesses going through the household but she has continued to have cough and congestion. Over the past week she has also begun to have some episodes of nausea and vomiting. 2 days ago she began having soft diarrhea stools occasionally watery. She reports subjective fevers at home with diffuse body aches. Patient has tried some over-the-counter cough and cold medicines without any change or improvement of symptoms. She denies any other aggravating or alleviating factors. No other associated symptoms.    Past Medical History  Diagnosis Date  . Migraine    History reviewed. No pertinent past surgical history. No family history on file. History  Substance Use Topics  . Smoking status: Current Some Day Smoker    Types: Cigarettes  . Smokeless tobacco: Not on file  . Alcohol Use: Yes     Comment: occasional per patient   OB History   Grav Para Term Preterm Abortions TAB SAB Ect Mult Living   1 1 1       1      Review of Systems  Constitutional: Positive for fever, chills, appetite change and fatigue.  HENT: Positive for congestion and rhinorrhea.   Respiratory: Positive for cough.   Gastrointestinal: Positive for nausea, vomiting and diarrhea. Negative for abdominal pain and constipation.  Genitourinary: Negative for dysuria, frequency, hematuria, flank pain, vaginal bleeding and vaginal discharge.  Musculoskeletal: Positive for myalgias.  All other systems  reviewed and are negative.    Allergies  Ketek and Tramadol  Home Medications   Current Outpatient Rx  Name  Route  Sig  Dispense  Refill  . albuterol (PROVENTIL HFA;VENTOLIN HFA) 108 (90 BASE) MCG/ACT inhaler   Inhalation   Inhale 1-2 puffs into the lungs every 6 (six) hours as needed for wheezing or shortness of breath.   1 Inhaler   0   . clonazePAM (KLONOPIN) 1 MG tablet   Oral   Take 1 mg by mouth 2 (two) times daily as needed for anxiety.         Marland Kitchen doxepin (SINEQUAN) 25 MG capsule   Oral   Take 25 mg by mouth at bedtime.         Marland Kitchen ibuprofen (ADVIL,MOTRIN) 200 MG tablet   Oral   Take 800 mg by mouth every 6 (six) hours as needed for moderate pain.         . Misc Natural Products (SINUS FORMULA PO)   Oral   Take 2 tablets by mouth daily as needed (congestion).         . norethindrone-ethinyl estradiol (MICROGESTIN,JUNEL,LOESTRIN) 1-20 MG-MCG tablet   Oral   Take 1 tablet by mouth daily.          BP 124/90  Pulse 80  Temp(Src) 97.8 F (36.6 C) (Oral)  Resp 18  SpO2 99% Physical Exam  Nursing note and vitals reviewed. Constitutional: She is oriented to person, place, and time. She appears well-developed and well-nourished. No distress.  HENT:  Head: Normocephalic.  Mouth/Throat:  Oropharynx is clear and moist.  Slight rhinorrhea and nasal congestion.  Eyes: Conjunctivae are normal.  Neck: Normal range of motion. Neck supple.  No meningeal signs  Cardiovascular: Normal rate and regular rhythm.   Pulmonary/Chest: Effort normal and breath sounds normal. No respiratory distress. She has no wheezes. She has no rales.  Occasional coughing  Abdominal: Soft.  Musculoskeletal: Normal range of motion.  Neurological: She is alert and oriented to person, place, and time.  Skin: Skin is warm and dry. No rash noted.  Psychiatric: She has a normal mood and affect. Her behavior is normal.    ED Course  Procedures   DIAGNOSTIC STUDIES: Oxygen Saturation  is 99% on room air.    COORDINATION OF CARE:  Nursing notes reviewed. Vital signs reviewed. Initial pt interview and examination performed.   12:25 AM-patient seen and evaluated. Patient well-appearing no acute distress. Does not appear severely ill or toxic. Respiratory distress. Discussed work up plan with pt at bedside, which includes . Pt agrees with plan.  Chest x-ray reviewed without any concerning findings. Patient tolerating by mouth fluids here.  Treatment plan initiated: Medications  ondansetron (ZOFRAN-ODT) disintegrating tablet 8 mg (not administered)     Imaging Review Dg Chest 2 View  06/10/2013   CLINICAL DATA:  Cough with weakness.  EXAM: CHEST  2 VIEW  COMPARISON:  None.  FINDINGS: The heart size and mediastinal contours are within normal limits. Both lungs are clear. The visualized skeletal structures are unremarkable.  IMPRESSION: No active cardiopulmonary disease.   Electronically Signed   By: Davonna Belling M.D.   On: 06/10/2013 21:15     MDM   1. Bronchitis      Cold n flu   Angus Seller, PA-C 06/11/13 2203

## 2013-06-12 NOTE — ED Provider Notes (Signed)
Medical screening examination/treatment/procedure(s) were performed by non-physician practitioner and as supervising physician I was immediately available for consultation/collaboration.  Joyleen Haselton, MD 06/12/13 0715 

## 2013-12-01 ENCOUNTER — Encounter (INDEPENDENT_AMBULATORY_CARE_PROVIDER_SITE_OTHER): Payer: Self-pay

## 2013-12-01 ENCOUNTER — Encounter: Payer: Self-pay | Admitting: Family

## 2013-12-01 ENCOUNTER — Ambulatory Visit (INDEPENDENT_AMBULATORY_CARE_PROVIDER_SITE_OTHER): Payer: BC Managed Care – PPO | Admitting: Family

## 2013-12-01 VITALS — BP 113/69 | HR 64 | Temp 99.1°F | Ht 66.0 in | Wt 116.0 lb

## 2013-12-01 DIAGNOSIS — N921 Excessive and frequent menstruation with irregular cycle: Secondary | ICD-10-CM

## 2013-12-01 DIAGNOSIS — Z3049 Encounter for surveillance of other contraceptives: Secondary | ICD-10-CM

## 2013-12-01 MED ORDER — NORGESTIMATE-ETH ESTRADIOL 0.25-35 MG-MCG PO TABS: 1.0000 | ORAL_TABLET | Freq: Every day | ORAL | Status: DC

## 2013-12-01 NOTE — Progress Notes (Signed)
   Subjective:    Patient ID: Meghan Guerrero, female    DOB: 1990/07/12, 23 y.o.   MRN: [id number]  HPI Pt presents to  Office with  menstrual spotting/bleeding over the last month. Pt states this happens about every 6 months once her "body gets use to pill". Pt has tried other birth controls in the past, but can not remember the names. States she has been on depo and [employer], dispensing. Pt denies any cramping or pregancy.    Review of Systems  Genitourinary: Positive for vaginal bleeding.  All other systems reviewed and are negative.      Objective:   Physical Exam  Vitals reviewed. Constitutional: She is oriented to person, place, and time. She appears well-developed and well-nourished.  Cardiovascular: Normal rate, regular rhythm, normal heart sounds and intact distal pulses.   No murmur heard. Pulmonary/Chest: Effort normal and breath sounds normal. No respiratory distress. She has no wheezes.  Abdominal: Soft. Bowel sounds are normal. She exhibits no distension. There is no tenderness.  Musculoskeletal: Normal range of motion. She exhibits no edema and no tenderness.  Neurological: She is alert and oriented to person, place, and time.  Skin: Skin is warm and dry. No rash noted. No erythema.  Psychiatric: She has a normal mood and affect. Her behavior is normal. Judgment and thought content normal.     BP 113/69  Pulse 64  Temp(Src) 99.1 F (37.3 C) (Oral)  Ht 5\' 6"  (1.676 m)  Wt 116 lb (52.617 kg)  BMI 18.73 kg/m2  LMP 10/24/2013      Assessment & Plan:  1. Irregular intermenstrual bleeding -Discussed not smoking while taking birth control -Discussed risks of DVT's -Set alarm and take birth control  - norgestimate-ethinyl estradiol (ORTHO-CYCLEN, 28,) 0.25-35 MG-MCG tablet; Take 1 tablet by mouth daily.  Dispense: 1 Package; Refill: 11  Jannifer Rodney, FNP

## 2013-12-01 NOTE — Patient Instructions (Signed)
Menstruation Menstruation is the monthly passing of blood, tissue, fluid and mucus, also know as a period. Your body is shedding the lining of the uterus. The flow, or amount of blood, usually lasts from 3 7 days each month. Hormones control the menstrual cycle. Hormones are a chemical substance produced by endocrine glands in the body to regulate different bodily functions. The first menstrual period may start any time between age 25 years to 16 years. However, it usually starts around age 12 years. Some girls have regular monthly menstrual cycles right from the beginning. However, it is not unusual to have only a couple of drops of blood or spotting when you first start menstruating. It is also not unusual to have two periods a month or miss a month or two when first starting your periods. SYMPTOMS   Mild to moderate abdominal cramps.  Aching or pain in the lower back area. Symptoms may occur 5 10 days before your menstrual period starts. These symptoms are referred to as premenstrual syndrome (PMS). These symptoms can include:  Headache.  Breast tenderness and swelling.  Bloating.  Tiredness (fatigue).  Mood changes.  Craving for certain foods. These are normal signs and symptoms and can vary in severity. To help relieve these problems, ask your caregiver if you can take over-the-counter medications for pain or discomfort. If the symptoms are not controllable, see your caregiver for help.  HORMONES INVOLVED IN MENSTRUATION Menstruation comes about because of hormones produced by the pituitary gland in the brain and the ovaries that affect the uterine lining. First, the pituitary gland in the brain produces the hormone follicle stimulating hormone (FSH). FSH stimulates the ovaries to produce estrogen, which thickens the uterine lining and begins to develop an egg in the ovary. About 14 days later, the pituitary gland produces another hormone called luteinizing hormone (LH). LH causes the egg  to come out of a sac in the ovary (ovulation). The empty sac on the ovary called the corpus luteum is stimulated by another hormone from the pituitary gland called luteotropin. The corpus luteum begins to produce the estrogen and progesterone hormone. The progesterone hormone prepares the lining of the uterus to have the fertilized egg (egg combined with sperm) attach to the lining of the uterus and begin to develop into a fetus. If the egg is not fertilized, the corpus luteum stops producing estrogen and progesterone, it disappears, the lining of the uterus sloughs off and a menstrual period begins. Then the menstrual cycle starts all over again and will continue monthly unless pregnancy occurs or menopause begins. The secretion of hormones is complex. Various parts of the body become involved in many chemical activities. Female sex hormones have other functions in a woman's body as well. Estrogen increases a woman's sex drive (libido). It naturally helps body get rid of fluids (diuretic). It also aids in the process of building new bone. Therefore, maintaining hormonal health is essential to all levels of a woman's well being. These hormones are usually present in normal amounts and cause you to menstruate. It is the relationship between the (small) levels of the hormones that is critical. When the balance is upset, menstrual irregularities can occur. HOW DOES THE MENSTRUAL CYCLE HAPPEN?  Menstrual cycles vary in length from 21 35 days with an average of 29 days. The cycle begins on the first day of bleeding. At this time, the pituitary gland in the brain releases FSH that travels through the bloodstream to the ovaries. The FSH stimulates the   follicles in the ovaries. This prepares the body for ovulation that occurs around the 14th day of the cycle. The ovaries produce estrogen, and this makes sure conditions are right in the uterus for implantation of the fertilized egg.  When the levels of estrogen reach a  high enough level, it signals the gland in the brain (pituitary gland) to release a surge of LH. This causes the release of the ripest egg from its follicle (ovulation). Usually only one follicle releases one egg, but sometimes more than one follicle releases an egg especially when stimulating the ovaries for in vitro fertilization. The egg can then be collected by either fallopian tube to await fertilization. The burst follicle within the ovary that is left behind is now called the corpus luteum or "yellow body." The corpus luteum continues to give off (secrete) reduced amounts of estrogen. This closes and hardens the cervix. It dries up the mucus to the naturally infertile condition.  The corpus luteum also begins to give off greater amounts of progesterone. This causes the lining of the uterus (endometrium) to thicken even more in preparation for the fertilized egg. The egg is starting to journey down from the fallopian tube to the uterus. It also signals the ovaries to stop releasing eggs. It assists in returning the cervical mucus to its infertile state.  If the egg implants successfully into the womb lining and pregnancy occurs, progesterone levels will continue to raise. It is often this hormone that gives some pregnant women a feeling of well being, like a "natural high." Progesterone levels drop again after childbirth.  If fertilization does not occur, the corpus luteum dies, stopping the production of hormones. This sudden drop in progesterone causes the uterine lining to break down, accompanied by blood (menstruation).  This starts the cycle back at day 1. The whole process starts all over again. Woman go through this cycle every month from puberty to menopause. Women have breaks only for pregnancy and breastfeeding (lactation), unless the woman has health problems that affect the female hormone system or chooses to use oral contraceptives to have unnatural menstrual periods. HOME CARE  INSTRUCTIONS   Keep track of your periods by using a calendar.  If you use tampons, get the least absorbent to avoid toxic shock syndrome.  Do not leave tampons in the vagina over night or longer than 6 hours.  Wear a sanitary pad over night.  Exercise 3 5 times a week or more.  Avoid foods and drinks that you know will make your symptoms worse before or during your period. SEEK MEDICAL CARE IF:   You develop a fever with your period.  Your periods are lasting more than 7 days.  Your period is so heavy that you have to change pads or tampons every 30 minutes.  You develop clots with your period and never had clots before.  You cannot get relief from over-the-counter medication for your symptoms.  Your period has not started, and it has been longer than 35 days. Document Released: 06/06/2002 Document Revised: 04/06/2013 Document Reviewed: 01/13/2013 [facility] Patient Information 2014 [place], [place]. Metrorrhagia  Metrorrhagia is uterine bleeding at irregular intervals, especially between menstrual periods.  CAUSES   Dysfunctional uterine bleeding.  Uterine lining growing outside the uterus (endometriosis).  Embryo adhering to uterine wall (implantation).  Pregnancy growing in the fallopian tubes (ectopic pregnancy).  Miscarriage.  Menopause.  Cancer of the reproduction organs.  Certain drugs such as hormonal contraceptives.  Inherited bleeding disorders.  Trauma.  Uterine  fibroids.  Sexually transmitted diseases (STDs).  Polycystic ovarian disease. DIAGNOSIS  A history will be taken.  A physical exam will be performed.  Other tests may include:  Blood tests.  A pregnancy test.  An ultrasound of the abdomen and pelvis.  A biopsy of the uterine lining.  AMRI or CT scan of the abdomen and pelvis. TREATMENT Treatment will depend on the cause. HOME CARE INSTRUCTIONS   Take all medicines as directed by your caregiver. Do not change or switch  medicines without talking to your caregiver.  Take all iron supplements exactly as directed by your caregiver. Iron supplements help to replace the iron your body loses from irregular bleeding.If you become constipated, increase the amount of fiber, fruits, and vegetables in your diet.  Do not take aspirin or medicines that contain aspirin for 1 week before your menstrual period or during your menstrual period. Aspirin may increase the bleeding.  Rest as much as possible if you change your sanitary pad or tampon more than once every 2 hours.  Eat well-balanced meals including foods high in iron, such as green leafy vegetables, red meat, liver, eggs, and whole-grain breads and cereals.  Do not try to lose weight until the abnormal bleeding is controlled and your blood iron level is back to normal. SEEK MEDICAL CARE IF:   You have nausea and vomiting, or you cannot keep foods down.  You feel dizzy or have diarrhea while taking medicine.  You have any problems that may be related to the medicine you are taking. SEEK IMMEDIATE MEDICAL CARE IF:   You have a fever.  You develop chills.  You become lightheaded or faint.  You need to change your sanitary pad or tampon more than once an hour.  Your bleeding becomesheavy.  You begin to pass clots or tissue. MAKE SURE YOU:   Understand these instructions.  Will watch your condition.  Will get help right away if you are not doing well or get worse. Document Released: 06/16/2005 Document Revised: 09/08/2011 Document Reviewed: 01/13/2011 [facility] Patient Information 2014 [place], [place].

## 2014-03-16 ENCOUNTER — Telehealth: Payer: Self-pay | Admitting: Family Medicine

## 2014-03-16 NOTE — Telephone Encounter (Signed)
Left detailed message that appt scheduled for 9/23 at 12:00 with Jannifer Rodney, FNP. May call back to change if necessary but there are limited appts spots available.

## 2014-03-22 ENCOUNTER — Ambulatory Visit (INDEPENDENT_AMBULATORY_CARE_PROVIDER_SITE_OTHER): Payer: BC Managed Care – PPO | Admitting: Family

## 2014-03-22 ENCOUNTER — Encounter: Payer: Self-pay | Admitting: Family

## 2014-03-22 ENCOUNTER — Encounter (INDEPENDENT_AMBULATORY_CARE_PROVIDER_SITE_OTHER): Payer: Self-pay

## 2014-03-22 VITALS — BP 104/65 | HR 41 | Temp 98.1°F | Ht 66.0 in | Wt 114.4 lb

## 2014-03-22 DIAGNOSIS — F411 Generalized anxiety disorder: Secondary | ICD-10-CM

## 2014-03-22 MED ORDER — CLONAZEPAM 0.5 MG PO TABS: 0.5000 mg | ORAL_TABLET | Freq: Every day | ORAL | Status: DC | PRN

## 2014-03-22 MED ORDER — VILAZODONE HCL 10 & 20 & 40 MG PO KIT: 1.0000 | PACK | Freq: Every day | ORAL | Status: DC

## 2014-03-22 NOTE — Patient Instructions (Signed)
Generalized Anxiety Disorder Generalized anxiety disorder (GAD) is a mental disorder. It interferes with life functions, including relationships, work, and school. GAD is different from normal anxiety, which everyone experiences at some point in their lives in response to specific life events and activities. Normal anxiety actually helps us prepare for and get through these life events and activities. Normal anxiety goes away after the event or activity is over.  GAD causes anxiety that is not necessarily related to specific events or activities. It also causes excess anxiety in proportion to specific events or activities. The anxiety associated with GAD is also difficult to control. GAD can vary from mild to severe. People with severe GAD can have intense waves of anxiety with physical symptoms (panic attacks).  SYMPTOMS The anxiety and worry associated with GAD are difficult to control. This anxiety and worry are related to many life events and activities and also occur more days than not for 6 months or longer. People with GAD also have three or more of the following symptoms (one or more in children):  Restlessness.   Fatigue.  Difficulty concentrating.   Irritability.  Muscle tension.  Difficulty sleeping or unsatisfying sleep. DIAGNOSIS GAD is diagnosed through an assessment by your health care provider. Your health care provider will ask you questions aboutyour mood,physical symptoms, and events in your life. Your health care provider may ask you about your medical history and use of alcohol or drugs, including prescription medicines. Your health care provider may also do a physical exam and blood tests. Certain medical conditions and the use of certain substances can cause symptoms similar to those associated with GAD. Your health care provider may refer you to a mental health specialist for further evaluation. TREATMENT The following therapies are usually used to treat GAD:    Medication. Antidepressant medication usually is prescribed for long-term daily control. Antianxiety medicines may be added in severe cases, especially when panic attacks occur.   Talk therapy (psychotherapy). Certain types of talk therapy can be helpful in treating GAD by providing support, education, and guidance. A form of talk therapy called cognitive behavioral therapy can teach you healthy ways to think about and react to daily life events and activities.  Stress managementtechniques. These include yoga, meditation, and exercise and can be very helpful when they are practiced regularly. A mental health specialist can help determine which treatment is best for you. Some people see improvement with one therapy. However, other people require a combination of therapies. Document Released: 10/11/2012 Document Revised: 10/31/2013 Document Reviewed: 10/11/2012 Exit[employer] Patient Information 2015 [employer]. This information is not intended to replace advice given to you by your health care provider. Make sure you discuss any questions you have with your health care provider.  

## 2014-03-22 NOTE — Progress Notes (Signed)
   Subjective:    Patient ID: Meghan Guerrero, female    DOB: [birth year], 23 y.o.   MRN: [id number]  Anxiety Presents for initial visit. Onset was 6 to 12 months ago. The problem has been waxing and waning. Symptoms include decreased concentration, excessive worry, insomnia, irritability, nervous/anxious behavior, obsessions, palpitations, panic and restlessness. Patient reports no depressed mood, hyperventilation, shortness of breath or suicidal ideas. Symptoms occur most days. The severity of symptoms is moderate. The symptoms are aggravated by family issues and work stress. The quality of sleep is poor.   Her past medical history is significant for anxiety/panic attacks. There is no history of depression. Past treatments include nothing.      Review of Systems  Constitutional: Positive for irritability.  HENT: Negative.   Eyes: Negative.   Respiratory: Negative.  Negative for shortness of breath.   Cardiovascular: Positive for palpitations.  Gastrointestinal: Negative.   Endocrine: Negative.   Genitourinary: Negative.   Musculoskeletal: Negative.   Neurological: Negative.  Negative for headaches.  Hematological: Negative.   Psychiatric/Behavioral: Positive for decreased concentration. Negative for suicidal ideas. The patient is nervous/anxious and has insomnia.   All other systems reviewed and are negative.      Objective:   Physical Exam  Vitals reviewed. Constitutional: She is oriented to person, place, and time. She appears well-developed and well-nourished. No distress.  HENT:  Head: Normocephalic and atraumatic.  Right Ear: External ear normal.  Left Ear: External ear normal.  Nose: Nose normal.  Mouth/Throat: Oropharynx is clear and moist.  Eyes: Pupils are equal, round, and reactive to light.  Neck: Normal range of motion. Neck supple. No thyromegaly present.  Cardiovascular: Normal rate, regular rhythm, normal heart sounds and intact distal pulses.   No murmur  heard. Pulmonary/Chest: Effort normal and breath sounds normal. No respiratory distress. She has no wheezes.  Abdominal: Soft. Bowel sounds are normal. She exhibits no distension. There is no tenderness.  Musculoskeletal: Normal range of motion. She exhibits no edema and no tenderness.  Neurological: She is alert and oriented to person, place, and time. She has normal reflexes. No cranial nerve deficit.  Skin: Skin is warm and dry.  Psychiatric: She has a normal mood and affect. Her behavior is normal. Judgment and thought content normal.    BP 104/65  Pulse 41  Temp(Src) 98.1 F (36.7 C) (Oral)  Ht $R'5\' 6"'CV$  (1.676 m)  Wt 114 lb 6.4 oz (51.891 kg)  BMI 18.47 kg/m2       Assessment & Plan:  1. GAD (generalized anxiety disorder) -Stress management discussed -RTO in 2 weeks - Vilazodone HCl 10 & 20 & 40 MG KIT; Take 1 tablet by mouth daily.  Dispense: 1 kit; Refill: 1 - clonazePAM (KLONOPIN) 0.5 MG tablet; Take 1 tablet (0.5 mg total) by mouth daily as needed for anxiety.  Dispense: 30 tablet; Refill: [place], FNP

## 2014-03-23 ENCOUNTER — Telehealth: Payer: Self-pay | Admitting: *Deleted

## 2014-03-23 ENCOUNTER — Telehealth: Payer: Self-pay | Admitting: Family

## 2014-03-23 DIAGNOSIS — F411 Generalized anxiety disorder: Secondary | ICD-10-CM

## 2014-03-23 MED ORDER — CLONAZEPAM 1 MG PO TABS: 1.0000 mg | ORAL_TABLET | Freq: Two times a day (BID) | ORAL | Status: DC | PRN

## 2014-03-23 NOTE — Telephone Encounter (Signed)
She was on a higher kolonopin at her old doctors office and she wants in bumped back up to 1

## 2014-03-23 NOTE — Telephone Encounter (Signed)
Pt states she has tried all these in the past and none of them help her. States they make her more "emotional".  PT states she was seeing behavior health who prescribed them for her.

## 2014-03-23 NOTE — Telephone Encounter (Signed)
She is saying she can not stay on the .5 at all its like taking tic-tacs

## 2014-03-23 NOTE — Telephone Encounter (Signed)
Meghan Guerrero, [place] [facility] will not cover viibryd unless pt has tried and failed at least two or their preferred drugs, they are bupropion,bupropion SR,bupropionXL, cymbalta.maprotiline, mirtazapine,nardil.parnate,savella,trazodone, venlafaxine and venlafaxineXR.  Again I'm sorry but medicaid doesn't give [place] much "wiggle" room.  Hope one of these will work for her.

## 2014-03-23 NOTE — Telephone Encounter (Signed)
Pt needs to try the 0.5 mg first. The 1 mg is the max dose and then we have no room to increase. Pt needs stay on Vilazodone HCL for long action and only use klonopin as needed.

## 2014-03-23 NOTE — Telephone Encounter (Signed)
New RX written for  of Kolonopin. Pt needs to bring in script of 0.5mg  before giving new RX. This is max dose. When she comes can we please give her samples of Viibryd. Lupita Leash is still working on it, but it looks like insurance is going to deny it.

## 2014-03-23 NOTE — Telephone Encounter (Signed)
Patient aware to bring in prescription

## 2014-03-24 NOTE — Telephone Encounter (Signed)
Patient came by and picked up new rx.

## 2014-03-27 NOTE — Telephone Encounter (Signed)
Meghan Guerrero disregard note on denial, I resubmitted with info that she said she had taken meds on list and they covered rx.

## 2014-04-04 ENCOUNTER — Ambulatory Visit: Payer: BC Managed Care – PPO | Admitting: Family

## 2014-05-01 ENCOUNTER — Encounter: Payer: Self-pay | Admitting: Family

## 2014-07-11 ENCOUNTER — Encounter (INDEPENDENT_AMBULATORY_CARE_PROVIDER_SITE_OTHER): Payer: Self-pay

## 2014-07-11 ENCOUNTER — Ambulatory Visit (INDEPENDENT_AMBULATORY_CARE_PROVIDER_SITE_OTHER): Payer: Medicaid Other | Admitting: Nurse Practitioner

## 2014-07-11 ENCOUNTER — Encounter: Payer: Self-pay | Admitting: Nurse Practitioner

## 2014-07-11 VITALS — BP 122/71 | HR 77 | Temp 96.8°F | Ht 66.0 in | Wt 120.0 lb

## 2014-07-11 DIAGNOSIS — N926 Irregular menstruation, unspecified: Secondary | ICD-10-CM | POA: Diagnosis not present

## 2014-07-11 DIAGNOSIS — F411 Generalized anxiety disorder: Secondary | ICD-10-CM | POA: Diagnosis not present

## 2014-07-11 DIAGNOSIS — N3000 Acute cystitis without hematuria: Secondary | ICD-10-CM

## 2014-07-11 DIAGNOSIS — R300 Dysuria: Secondary | ICD-10-CM | POA: Diagnosis not present

## 2014-07-11 LAB — POCT UA - MICROSCOPIC ONLY
BACTERIA, U MICROSCOPIC: NEGATIVE
CASTS, UR, LPF, POC: NEGATIVE
CRYSTALS, UR, HPF, POC: NEGATIVE
MUCUS UA: NEGATIVE
RBC, urine, microscopic: NEGATIVE
Yeast, UA: NEGATIVE

## 2014-07-11 LAB — POCT URINALYSIS DIPSTICK
BILIRUBIN UA: NEGATIVE
Blood, UA: NEGATIVE
GLUCOSE UA: NEGATIVE
Ketones, UA: NEGATIVE
NITRITE UA: NEGATIVE
Protein, UA: NEGATIVE
SPEC GRAV UA: 1.015
Urobilinogen, UA: NEGATIVE
pH, UA: 6

## 2014-07-11 MED ORDER — CLONAZEPAM 1 MG PO TABS: 1.0000 mg | ORAL_TABLET | Freq: Two times a day (BID) | ORAL | Status: DC | PRN

## 2014-07-11 MED ORDER — NITROFURANTOIN MONOHYD MACRO 100 MG PO CAPS: 100.0000 mg | ORAL_CAPSULE | Freq: Two times a day (BID) | ORAL | Status: DC

## 2014-07-11 NOTE — Progress Notes (Signed)
Subjective:    Patient ID: Lucious GrovesKathy Sue Lowery, female    DOB: October 18, 1990, 24 y.o.   MRN: [id number]  HPI Patient in c/o right lower back pain that comes and goes- started 3 days ago. Urinary frequency and urgency without dysuria. She says that she missed 2 of her birth control pills and insurance would not let her get a new pack so she just kept taking her current pack and now your menses is all messed up. She also wants to discuss clonazepam- she was on a different color pill until she saw [facility] and now they are a different color- not sure of dosage. SH e has been out clonazepam for 2 months and she is having anger issues and "looses her cool easily".    Review of Systems  Constitutional: Negative.   HENT: Negative.   Respiratory: Negative.   Cardiovascular: Negative.   Gastrointestinal: Negative.   Genitourinary: Positive for urgency and frequency. Negative for dysuria and pelvic pain.  Neurological: Negative.   Psychiatric/Behavioral: The patient is nervous/anxious.   All other systems reviewed and are negative.      Objective:   Physical Exam  Constitutional: She is oriented to person, place, and time. She appears well-developed and well-nourished.  Cardiovascular: Normal rate, regular rhythm and normal heart sounds.   Pulmonary/Chest: Effort normal and breath sounds normal.  Abdominal: Soft. Bowel sounds are normal. She exhibits no distension. There is no tenderness. There is no rebound and no guarding.  Genitourinary:  No CVA tenderness  Neurological: She is alert and oriented to person, place, and time.  Skin: Skin is warm and dry.  Psychiatric:  Fast talking - good eye contact    BP 122/71 mmHg  Pulse 77  Temp(Src) 96.8 F (36 C) (Oral)  Ht 5\' 6"  (1.676 m)  Wt 120 lb (54.432 kg)  BMI 19.38 kg/m2  Results for orders placed or performed in visit on 07/11/14  POCT urinalysis dipstick  Result Value Ref Range   Color, UA yellow    Clarity, UA clear    Glucose, UA  neg    Bilirubin, UA neg    Ketones, UA neg    Spec Grav, UA 1.015    Blood, UA neg    pH, UA 6.0    Protein, UA neg    Urobilinogen, UA negative    Nitrite, UA neg    Leukocytes, UA moderate (2+)   POCT UA - Microscopic Only  Result Value Ref Range   WBC, Ur, HPF, POC 10-15    RBC, urine, microscopic NEG    Bacteria, U Microscopic NEG    Mucus, UA NEG    Epithelial cells, urine per micros OCC    Crystals, Ur, HPF, POC NEG    Casts, Ur, LPF, POC NEG    Yeast, UA NEG          Assessment & Plan:  1. Dysuria - POCT urinalysis dipstick - POCT UA - Microscopic Only  2. Acute cystitis without hematuria Force fluids AZO over the counter X2 days RTO prn Culture pending - nitrofurantoin, macrocrystal-monohydrate, (MACROBID) 100 MG capsule; Take 1 capsule (100 mg total) by mouth 2 (two) times daily.  Dispense: 14 capsule; Refill: 0  3. GAD (generalized anxiety disorder) Stress management - clonazePAM (KLONOPIN) 1 MG tablet; Take 1 tablet (1 mg total) by mouth 2 (two) times daily as needed for anxiety.  Dispense: 30 tablet; Refill: 1  4. Irregular menses Continue birth control pills - should regulate itself  out on it's own  Mary-Margaret Daphine Deutscher, FNP

## 2014-07-11 NOTE — Patient Instructions (Signed)
Stress and Stress Management Stress is a normal reaction to life events. It is what you feel when life demands more than you are used to or more than you can handle. Some stress can be useful. For example, the stress reaction can help you catch the last bus of the day, study for a test, or meet a deadline at work. But stress that occurs too often or for too long can cause problems. It can affect your emotional health and interfere with relationships and normal daily activities. Too much stress can weaken your immune system and increase your risk for physical illness. If you already have a medical problem, stress can make it worse. CAUSES  All sorts of life events may cause stress. An event that causes stress for one person may not be stressful for another person. Major life events commonly cause stress. These may be positive or negative. Examples include losing your job, moving into a new home, getting married, having a baby, or losing a loved one. Less obvious life events may also cause stress, especially if they occur day after day or in combination. Examples include working long hours, driving in traffic, caring for children, being in debt, or being in a difficult relationship. SIGNS AND SYMPTOMS Stress may cause emotional symptoms including, the following:  Anxiety. This is feeling worried, afraid, on edge, overwhelmed, or out of control.  Anger. This is feeling irritated or impatient.  Depression. This is feeling sad, down, helpless, or guilty.  Difficulty focusing, remembering, or making decisions. Stress may cause physical symptoms, including the following:   Aches and pains. These may affect your head, neck, back, stomach, or other areas of your body.  Tight muscles or clenched jaw.  Low energy or trouble sleeping. Stress may cause unhealthy behaviors, including the following:   Eating to feel better (overeating) or skipping meals.  Sleeping too little, too much, or both.  Working  too much or putting off tasks (procrastination).  Smoking, drinking alcohol, or using drugs to feel better. DIAGNOSIS  Stress is diagnosed through an assessment by your health care provider. Your health care provider will ask questions about your symptoms and any stressful life events.Your health care provider will also ask about your medical history and may order blood tests or other tests. Certain medical conditions and medicine can cause physical symptoms similar to stress. Mental illness can cause emotional symptoms and unhealthy behaviors similar to stress. Your health care provider may refer you to a mental health professional for further evaluation.  TREATMENT  Stress management is the recommended treatment for stress.The goals of stress management are reducing stressful life events and coping with stress in healthy ways.  Techniques for reducing stressful life events include the following:  Stress identification. Self-monitor for stress and identify what causes stress for you. These skills may help you to avoid some stressful events.  Time management. Set your priorities, keep a calendar of events, and learn to say "no." These tools can help you avoid making too many commitments. Techniques for coping with stress include the following:  Rethinking the problem. Try to think realistically about stressful events rather than ignoring them or overreacting. Try to find the positives in a stressful situation rather than focusing on the negatives.  Exercise. Physical exercise can release both physical and emotional tension. The key is to find a form of exercise you enjoy and do it regularly.  Relaxation techniques. These relax the body and mind. Examples include yoga, meditation, tai chi, biofeedback, deep  breathing, progressive muscle relaxation, listening to music, being out in nature, journaling, and other hobbies. Again, the key is to find one or more that you enjoy and can do  regularly.  Healthy lifestyle. Eat a balanced diet, get plenty of sleep, and do not smoke. Avoid using alcohol or drugs to relax.  Strong support network. Spend time with family, friends, or other people you enjoy being around.Express your feelings and talk things over with someone you trust. Counseling or talktherapy with a mental health professional may be helpful if you are having difficulty managing stress on your own. Medicine is typically not recommended for the treatment of stress.Talk to your health care provider if you think you need medicine for symptoms of stress. HOME CARE INSTRUCTIONS  Keep all follow-up visits as directed by your health care provider.  Take all medicines as directed by your health care provider. SEEK MEDICAL CARE IF:  Your symptoms get worse or you start having new symptoms.  You feel overwhelmed by your problems and can no longer manage them on your own. SEEK IMMEDIATE MEDICAL CARE IF:  You feel like hurting yourself or someone else. Document Released: 12/10/2000 Document Revised: 10/31/2013 Document Reviewed: 02/08/2013 [employer] Patient Information 2015 [employer]. This information is not intended to replace advice given to you by your health care provider. Make sure you discuss any questions you have with your health care provider.  

## 2014-08-11 ENCOUNTER — Ambulatory Visit (INDEPENDENT_AMBULATORY_CARE_PROVIDER_SITE_OTHER): Payer: Medicaid Other | Admitting: Family Medicine

## 2014-08-11 ENCOUNTER — Encounter: Payer: Self-pay | Admitting: Family Medicine

## 2014-08-11 VITALS — BP 120/70 | HR 81 | Temp 98.1°F | Ht 65.5 in | Wt 116.0 lb

## 2014-08-11 DIAGNOSIS — Z124 Encounter for screening for malignant neoplasm of cervix: Secondary | ICD-10-CM

## 2014-08-11 DIAGNOSIS — Z0000 Encounter for general adult medical examination without abnormal findings: Secondary | ICD-10-CM

## 2014-08-11 DIAGNOSIS — Z01419 Encounter for gynecological examination (general) (routine) without abnormal findings: Secondary | ICD-10-CM

## 2014-08-11 DIAGNOSIS — N898 Other specified noninflammatory disorders of vagina: Secondary | ICD-10-CM

## 2014-08-11 DIAGNOSIS — Z3041 Encounter for surveillance of contraceptive pills: Secondary | ICD-10-CM

## 2014-08-11 LAB — POCT WET PREP (WET MOUNT)
KOH WET PREP POC: POSITIVE
TRICHOMONAS WET PREP HPF POC: NEGATIVE

## 2014-08-11 MED ORDER — METRONIDAZOLE 500 MG PO TABS: 500.0000 mg | ORAL_TABLET | Freq: Two times a day (BID) | ORAL | Status: DC

## 2014-08-11 MED ORDER — FLUCONAZOLE 100 MG PO TABS: 100.0000 mg | ORAL_TABLET | Freq: Every day | ORAL | Status: DC

## 2014-08-11 MED ORDER — ECONAZOLE NITRATE 1 % EX CREA: TOPICAL_CREAM | Freq: Two times a day (BID) | CUTANEOUS | Status: DC

## 2014-08-11 NOTE — Progress Notes (Deleted)
  Subjective:     Meghan Guerrero is a 24 y.o. female and is here for a comprehensive physical exam. The patient reports problems - having two teeth cut out on March 7. Concerned the Keflex prescribed by the dentist will lead to yeast in Vagina. Also has an itch in her left fitth toe.  History   Social History  . Marital Status: Single    Spouse Name: N/A  . Number of Children: N/A  . Years of Education: N/A   Occupational History  . Not on file.   Social History Main Topics  . Smoking status: Current Some Day Smoker -- 0.50 packs/day    Types: Cigarettes  . Smokeless tobacco: Never Used  . Alcohol Use: Yes     Comment: occasional per patient  . Drug Use: No  . Sexual Activity: Yes    Birth Control/ Protection: Pill   Other Topics Concern  . Not on file   Social History Narrative   Health Maintenance  Topic Date Due  . Janet BerlinETANUS/TDAP  11/09/2014 (Originally 01/22/2008)  . INFLUENZA VACCINE  02/09/2015 (Originally 01/28/2014)  . PAP SMEAR  12/29/2015    {Common ambulatory SmartLinks:19316}  Review of Systems   Objective:    {Exam, [phone]}    Assessment:    Healthy female exam. ***     Plan:     See After Visit Summary for Counseling Recommendations

## 2014-08-11 NOTE — Progress Notes (Signed)
Subjective:  Patient ID: Meghan Guerrero, female    DOB: 20-Mar-1991  Age: 24 y.o. MRN: [id number]  CC: Gynecologic Exam   HPI Meghan Guerrero presents for annual exam with Pap smear. She is sexually active but uses birth control regularly she has a 72-year-old son. She currently is taking Keflex for a dental infection. She would like to have Diflucan to take 2 mitigate the yeast that she frequently gets.  History Meghan Guerrero has a past medical history of Migraine.   She has no past surgical history on file.   Her family history is not on file.She reports that she has been smoking Cigarettes.  She has been smoking about 0.50 packs per day. She has never used smokeless tobacco. She reports that she drinks alcohol. She reports that she does not use illicit drugs.  Current Outpatient Prescriptions on File Prior to Visit  Medication Sig Dispense Refill  . clonazePAM (KLONOPIN) 1 MG tablet Take 1 tablet (1 mg total) by mouth 2 (two) times daily as needed for anxiety. 30 tablet 1  . ibuprofen (ADVIL,MOTRIN) 200 MG tablet Take 800 mg by mouth every 6 (six) hours as needed for moderate pain.    . norgestimate-ethinyl estradiol (ORTHO-CYCLEN, 28,) 0.25-35 MG-MCG tablet Take 1 tablet by mouth daily. 1 Package 11   No current facility-administered medications on file prior to visit.    ROS Review of Systems  Constitutional: Negative for fever, chills, diaphoresis, appetite change, fatigue and unexpected weight change.  HENT: Negative for congestion, ear pain, hearing loss, postnasal drip, rhinorrhea, sneezing, sore throat and trouble swallowing.   Eyes: Negative for pain.  Respiratory: Negative for cough, chest tightness and shortness of breath.   Cardiovascular: Negative for chest pain and palpitations.  Gastrointestinal: Negative for nausea, vomiting, abdominal pain, diarrhea and constipation.  Genitourinary: Negative for dysuria, frequency and menstrual problem.  Musculoskeletal: Negative for  joint swelling and arthralgias.  Skin: Negative for rash.  Neurological: Negative for dizziness, weakness, numbness and headaches.  Psychiatric/Behavioral: Negative for dysphoric mood and agitation.    Objective:  BP 120/70 mmHg  Pulse 81  Temp(Src) 98.1 F (36.7 C) (Oral)  Ht 5' 5.5" (1.664 m)  Wt 116 lb (52.617 kg)  BMI 19.00 kg/m2  LMP 07/31/2014  BP Readings from Last 3 Encounters:  08/11/14 120/70  07/11/14 122/71  03/22/14 104/65    Wt Readings from Last 3 Encounters:  08/11/14 116 lb (52.617 kg)  07/11/14 120 lb (54.432 kg)  03/22/14 114 lb 6.4 oz (51.891 kg)     Physical Exam  Constitutional: She is oriented to person, place, and time. She appears well-developed and well-nourished. No distress.  HENT:  Head: Normocephalic and atraumatic.  Right Ear: External ear normal.  Left Ear: External ear normal.  Nose: Nose normal.  Mouth/Throat: Oropharynx is clear and moist.  Eyes: Conjunctivae and EOM are normal. Pupils are equal, round, and reactive to light.  Neck: Normal range of motion. Neck supple. No thyromegaly present.  Cardiovascular: Normal rate, regular rhythm and normal heart sounds.   No murmur heard. Pulmonary/Chest: Effort normal and breath sounds normal. No respiratory distress. She has no wheezes. She has no rales.  Abdominal: Soft. Bowel sounds are normal. She exhibits no distension. There is no tenderness.  Genitourinary: Vagina normal and uterus normal. No breast swelling, tenderness (no masses) or discharge. Pelvic exam was performed with patient supine. Cervix exhibits discharge (thin to creamy white, adhering to cervix.) and friability (minimal with sample collection). Right adnexum  displays no mass and no tenderness. Left adnexum displays no mass and no tenderness. No vaginal discharge found.  Lymphadenopathy:    She has no cervical adenopathy.  Neurological: She is alert and oriented to person, place, and time. She has normal reflexes.  Skin:  Skin is warm and dry.  Psychiatric: She has a normal mood and affect. Her behavior is normal. Judgment and thought content normal.   Results for orders placed or performed in visit on 08/11/14  POCT Wet Prep [facility])  Result Value Ref Range   Source Wet Prep POC vaginal    WBC, Wet Prep HPF POC 15-20    Bacteria Wet Prep HPF POC many    Clue Cells Wet Prep HPF POC Moderate    Yeast Wet Prep HPF POC None    KOH Wet Prep POC positive    Trichomonas Wet Prep HPF POC negative      Lab Results  Component Value Date   WBC 12.2* 06/10/2013   HGB 14.1 06/10/2013   HCT 42.4 06/10/2013   PLT 203 06/10/2013   GLUCOSE 83 06/10/2013   ALT 11 06/10/2013   AST 15 06/10/2013   NA 139 06/10/2013   K 4.0 06/10/2013   CL 104 06/10/2013   CREATININE 0.64 06/10/2013   BUN 11 06/10/2013   CO2 26 06/10/2013    Dg Chest 2 View  06/10/2013   CLINICAL DATA:  Cough with weakness.  EXAM: CHEST  2 VIEW  COMPARISON:  None.  FINDINGS: The heart size and mediastinal contours are within normal limits. Both lungs are clear. The visualized skeletal structures are unremarkable.  IMPRESSION: No active cardiopulmonary disease.   Electronically Signed   By: Rolla Flatten M.D.   On: 06/10/2013 21:15    Assessment & Plan:   Meghan Guerrero was seen today for gynecologic exam.  Diagnoses and all orders for this visit:  Screening for malignant neoplasm of cervix Orders: -     Cytology - PAP ([place]) -     Pap IG, CT/NG w/ reflex HPV when ASC-U  Encounter for routine gynecological examination Orders: -     Cytology - PAP ([place])  Encounter for surveillance of contraceptive pills [Z30.41] Orders: -     Cytology - PAP () -     Pap IG, CT/NG w/ reflex HPV when ASC-U  Wellness examination Orders: -     CMP14+EGFR -     Lipid panel -     POCT CBC -     Thyroid Panel With TSH  Vaginal discharge Orders: -     POCT Wet Prep [employer])  Other orders -     metroNIDAZOLE (FLAGYL) 500 MG  tablet; Take 1 tablet (500 mg total) by mouth 2 (two) times daily. For infection. Take the entire course -     econazole nitrate 1 % cream; Apply topically 2 (two) times daily. -     fluconazole (DIFLUCAN) 100 MG tablet; Take 1 tablet (100 mg total) by mouth daily.   I have discontinued Ms. Lowery's Vilazodone HCl and nitrofurantoin (macrocrystal-monohydrate). I am also having her start on metroNIDAZOLE, econazole nitrate, and fluconazole. Additionally, I am having her maintain her ibuprofen, norgestimate-ethinyl estradiol, and clonazePAM.  Meds ordered this encounter  Medications  . metroNIDAZOLE (FLAGYL) 500 MG tablet    Sig: Take 1 tablet (500 mg total) by mouth 2 (two) times daily. For infection. Take the entire course    Dispense:  30 tablet    Refill:  0  .  econazole nitrate 1 % cream    Sig: Apply topically 2 (two) times daily.    Dispense:  30 g    Refill:  0  . fluconazole (DIFLUCAN) 100 MG tablet    Sig: Take 1 tablet (100 mg total) by mouth daily.    Dispense:  7 tablet    Refill:  1    Follow-up: Return in 1 year (on 08/12/2015).  Claretta Fraise, M.D.

## 2014-08-15 LAB — PAP IG, CT-NG, RFX HPV ASCU
Chlamydia, Nuc. Acid Amp: NEGATIVE
Gonococcus by Nucleic Acid Amp: NEGATIVE
PAP SMEAR COMMENT: 0

## 2014-08-28 ENCOUNTER — Telehealth: Payer: Self-pay | Admitting: Family Medicine

## 2014-08-28 MED ORDER — MEDROXYPROGESTERONE ACETATE 150 MG/ML IM SUSP: 150.0000 mg | Freq: Once | INTRAMUSCULAR | Status: DC

## 2014-08-28 NOTE — Telephone Encounter (Signed)
Stp she states she wants to use Depo-Provera for birth control method and she has used it in the past. Her last pill (OCP) is this Thursday. Please advise.

## 2014-08-28 NOTE — Telephone Encounter (Signed)
I sent a prescription for Depo-Provera to her pharmacy. She should pick that up and bring it here for the injection.

## 2014-08-28 NOTE — Telephone Encounter (Signed)
Pt notified of RX Verbalizes understanding 

## 2014-08-31 ENCOUNTER — Ambulatory Visit (INDEPENDENT_AMBULATORY_CARE_PROVIDER_SITE_OTHER): Payer: Medicaid Other | Admitting: *Deleted

## 2014-08-31 DIAGNOSIS — Z30013 Encounter for initial prescription of injectable contraceptive: Secondary | ICD-10-CM | POA: Diagnosis not present

## 2014-08-31 LAB — POCT URINE PREGNANCY: PREG TEST UR: NEGATIVE

## 2014-08-31 MED ORDER — MEDROXYPROGESTERONE ACETATE 150 MG/ML IM SUSP
150.0000 mg | INTRAMUSCULAR | Status: DC
  Administered 2014-08-31: 150 mg via INTRAMUSCULAR

## 2014-08-31 NOTE — Progress Notes (Signed)
Depo Provera given and tolerated well.  

## 2014-08-31 NOTE — Patient Instructions (Signed)

## 2014-09-01 ENCOUNTER — Other Ambulatory Visit: Payer: Self-pay | Admitting: Family Medicine

## 2014-09-01 MED ORDER — KETOCONAZOLE 2 % EX CREA: 1.0000 "application " | TOPICAL_CREAM | Freq: Two times a day (BID) | CUTANEOUS | Status: DC

## 2014-09-20 ENCOUNTER — Encounter: Payer: Self-pay | Admitting: Physician Assistant

## 2014-09-20 ENCOUNTER — Ambulatory Visit (INDEPENDENT_AMBULATORY_CARE_PROVIDER_SITE_OTHER): Payer: Medicaid Other | Admitting: Physician Assistant

## 2014-09-20 VITALS — BP 132/71 | HR 93 | Temp 97.2°F | Ht 65.5 in | Wt 114.2 lb

## 2014-09-20 DIAGNOSIS — H109 Unspecified conjunctivitis: Secondary | ICD-10-CM

## 2014-09-20 DIAGNOSIS — H1089 Other conjunctivitis: Secondary | ICD-10-CM | POA: Diagnosis not present

## 2014-09-20 DIAGNOSIS — A499 Bacterial infection, unspecified: Secondary | ICD-10-CM | POA: Diagnosis not present

## 2014-09-20 DIAGNOSIS — R0981 Nasal congestion: Secondary | ICD-10-CM

## 2014-09-20 DIAGNOSIS — R059 Cough, unspecified: Secondary | ICD-10-CM

## 2014-09-20 DIAGNOSIS — R05 Cough: Secondary | ICD-10-CM | POA: Diagnosis not present

## 2014-09-20 LAB — POCT INFLUENZA A/B
INFLUENZA B, POC: NEGATIVE
Influenza A, POC: NEGATIVE

## 2014-09-20 MED ORDER — POLYMYXIN B-TRIMETHOPRIM 10000-0.1 UNIT/ML-% OP SOLN: 1.0000 [drp] | OPHTHALMIC | Status: DC

## 2014-09-20 NOTE — Progress Notes (Signed)
   Subjective:    Patient ID: Meghan Guerrero, female    DOB: 1991/03/15, 24 y.o.   MRN: [id number]  HPI 24 y/o female presents with c/o bilateral red eyes, burning, itching  x 1 day. WAs seen at Urgent Care 3 days ago and was diagnosed with Sinus infection, given Augmentin x 18 days.     Review of Systems  Constitutional: Negative for fever, chills, diaphoresis and fatigue.  HENT: Positive for congestion (nasal ), rhinorrhea, sinus pressure and sneezing. Negative for ear pain, postnasal drip and sore throat.   Eyes: Positive for pain, redness (left) and itching.  Respiratory: Positive for cough. Negative for shortness of breath and wheezing.   Cardiovascular: Negative.   Gastrointestinal: Negative.   Genitourinary: Negative.        Objective:   Physical Exam  Constitutional: She is oriented to person, place, and time. She appears well-developed and well-nourished. No distress.  HENT:  Mouth/Throat: No oropharyngeal exudate.  Bilateral TM slightly erythematous Posterior pharynx injection and erythema, no tonsillar hypertrophy   Eyes: Right eye exhibits no discharge. Left eye exhibits discharge.  Injected conjunctiva left eye  Cardiovascular: Normal rate, regular rhythm and normal heart sounds.  Exam reveals no gallop and no friction rub.   No murmur heard. Pulmonary/Chest: Effort normal and breath sounds normal. No respiratory distress. She has no wheezes. She has no rales. She exhibits no tenderness.  Neurological: She is alert and oriented to person, place, and time.  Skin: She is not diaphoretic.  Nursing note and vitals reviewed.         Assessment & Plan:  1. Bacterial Conjunctivits left eye: trimethoprim-polymyxin b (POLYTRIM) ophthalmic solution left eye, 1 drop q 4 hours x 10 days.  2. Sinusitis: Continue Augmentin as prescribed by Urgent Care.   F/U if s/s worsen or dni

## 2014-09-25 ENCOUNTER — Encounter: Payer: Self-pay | Admitting: *Deleted

## 2014-10-11 ENCOUNTER — Telehealth: Payer: Self-pay | Admitting: Nurse Practitioner

## 2014-10-11 DIAGNOSIS — N921 Excessive and frequent menstruation with irregular cycle: Secondary | ICD-10-CM

## 2014-10-11 NOTE — Telephone Encounter (Signed)
Need to know what sjhe wants to switch back to?

## 2014-10-12 MED ORDER — NORGESTIMATE-ETH ESTRADIOL 0.25-35 MG-MCG PO TABS: 1.0000 | ORAL_TABLET | Freq: Every day | ORAL | Status: DC

## 2014-10-12 NOTE — Telephone Encounter (Signed)
Patient was on sprintec 28

## 2014-10-12 NOTE — Telephone Encounter (Signed)
sprintec rx sent to pharmacy

## 2014-10-12 NOTE — Telephone Encounter (Signed)
[facility]

## 2014-10-12 NOTE — Telephone Encounter (Signed)
Patient aware.

## 2014-10-19 ENCOUNTER — Ambulatory Visit (INDEPENDENT_AMBULATORY_CARE_PROVIDER_SITE_OTHER): Payer: Medicaid Other | Admitting: Nurse Practitioner

## 2014-10-19 ENCOUNTER — Encounter: Payer: Self-pay | Admitting: Nurse Practitioner

## 2014-10-19 VITALS — BP 122/69 | HR 81 | Temp 98.2°F | Ht 65.5 in | Wt 118.0 lb

## 2014-10-19 DIAGNOSIS — L237 Allergic contact dermatitis due to plants, except food: Secondary | ICD-10-CM

## 2014-10-19 MED ORDER — PREDNISONE 20 MG PO TABS: ORAL_TABLET | ORAL | Status: DC

## 2014-10-19 MED ORDER — METHYLPREDNISOLONE ACETATE 80 MG/ML IJ SUSP
80.0000 mg | Freq: Once | INTRAMUSCULAR | Status: AC
  Administered 2014-10-19: 80 mg via INTRAMUSCULAR

## 2014-10-19 NOTE — Patient Instructions (Signed)

## 2014-10-19 NOTE — Progress Notes (Signed)
  Subjective:     Lucious GrovesKathy Sue Lowery is a 24 y.o. female who presents for evaluation of a rash involving the face and hand. Rash started 3 days ago. Lesions are purulent and red, and blistering in texture. Rash has not changed over time. Rash is pruritic. Associated symptoms: none. Patient denies: abdominal pain, congestion, crankiness, fever, headache, myalgia and sore throat. Patient has had contacts with similar rash. Patient has had new exposures (soaps, lotions, laundry detergents, foods, medications, plants, insects or animals).  The following portions of the patient's history were reviewed and updated as appropriate: allergies, current medications, past family history, past medical history, past social history, past surgical history and problem list.  Review of Systems Pertinent items are noted in HPI.    Objective:    BP 122/69 mmHg  Pulse 81  Temp(Src) 98.2 F (36.8 C) (Oral)  Ht 5' 5.5" (1.664 m)  Wt 118 lb (53.524 kg)  BMI 19.33 kg/m2  LMP 08/31/2014 General:  alert and cooperative  Skin:  erythema noted on extremities and face and pustules noted on extremities and face     Assessment:    contact dermatitis: plants poison ivy    Plan:   1. Contact dermatitis due to poison ivy    Meds ordered this encounter  Medications  . methylPREDNISolone acetate (DEPO-MEDROL) injection 80 mg    Sig:   . predniSONE (DELTASONE) 20 MG tablet    Sig: 2 po at sametime daily for 5 days- start tomorrow    Dispense:  10 tablet    Refill:  0    Order Specific Question:  Supervising Provider    Answer:  Ernestina PennaMOORE, DONALD W [[id number]]   Cool compresseses Do not scratch clamine lotion OTC Benadryl OTC rto prn  Mary-Margaret Daphine DeutscherMartin, FNP

## 2014-11-29 ENCOUNTER — Ambulatory Visit: Payer: Medicaid Other

## 2014-12-14 ENCOUNTER — Encounter: Payer: Self-pay | Admitting: Physician Assistant

## 2014-12-14 ENCOUNTER — Ambulatory Visit (INDEPENDENT_AMBULATORY_CARE_PROVIDER_SITE_OTHER): Payer: Medicaid Other | Admitting: Physician Assistant

## 2014-12-14 VITALS — BP 122/69 | HR 79 | Ht 65.5 in | Wt 123.0 lb

## 2014-12-14 DIAGNOSIS — N760 Acute vaginitis: Secondary | ICD-10-CM

## 2014-12-14 DIAGNOSIS — A499 Bacterial infection, unspecified: Secondary | ICD-10-CM | POA: Diagnosis not present

## 2014-12-14 DIAGNOSIS — R21 Rash and other nonspecific skin eruption: Secondary | ICD-10-CM | POA: Diagnosis not present

## 2014-12-14 DIAGNOSIS — R399 Unspecified symptoms and signs involving the genitourinary system: Secondary | ICD-10-CM

## 2014-12-14 DIAGNOSIS — B9689 Other specified bacterial agents as the cause of diseases classified elsewhere: Secondary | ICD-10-CM

## 2014-12-14 DIAGNOSIS — N898 Other specified noninflammatory disorders of vagina: Secondary | ICD-10-CM

## 2014-12-14 LAB — POCT UA - MICROSCOPIC ONLY
Casts, Ur, LPF, POC: NEGATIVE
Crystals, Ur, HPF, POC: NEGATIVE
Mucus, UA: NEGATIVE
RBC, urine, microscopic: NEGATIVE
YEAST UA: NEGATIVE

## 2014-12-14 LAB — POCT URINALYSIS DIPSTICK
Bilirubin, UA: NEGATIVE
Blood, UA: NEGATIVE
Glucose, UA: NEGATIVE
Ketones, UA: NEGATIVE
Nitrite, UA: NEGATIVE
Protein, UA: NEGATIVE
Spec Grav, UA: 1.025
UROBILINOGEN UA: NEGATIVE
pH, UA: 6.5

## 2014-12-14 LAB — POCT WET PREP (WET MOUNT)

## 2014-12-14 MED ORDER — METRONIDAZOLE 500 MG PO TABS: 500.0000 mg | ORAL_TABLET | Freq: Two times a day (BID) | ORAL | Status: DC

## 2014-12-14 NOTE — Progress Notes (Signed)
   Subjective:    Patient ID: Meghan Guerrero, female    DOB: 13-Aug-1990, 24 y.o.   MRN: [id number]  HPI 24 y/o female presents for vaginal discharge. Was treated for BV on 08/16/14. She feels that the infection never went away because she is still having milky discharge that can be yellowish/brown. She is sexually active with the same partner she was with months ago. She denies h/o STD's. Recently changed birth controls from Depo to ortho-cyclen x 1 month ago.   She also has c/o rash on hands, groin and left arm pit 5 days ago when she laid out at the beach. Nonpruritic. Started after she laid out in the sun. Has used triamcinolone BID x 3 days and caress soap with no relief.     Review of Systems  Genitourinary: Positive for vaginal discharge (whitish, milky, no odor, occasional brown/yellow ).  Skin: Positive for rash (nonpruritic on both hands, groin and under left armpit. x 5 days ).       Objective:   Physical Exam  Constitutional: She appears well-developed and well-nourished. No distress.  Skin: She is not diaphoretic.  Very tan with slight erythematous papules along pantylline in groin. Localized patch under left axilla. Whitish papules on dorsal surface of bilateral hands, similar to appearance in eczema  Nursing note and vitals reviewed.  Results for orders placed or performed in visit on 12/14/14  POCT urinalysis dipstick  Result Value Ref Range   Color, UA gold    Clarity, UA clear    Glucose, UA neg    Bilirubin, UA neg    Ketones, UA neg    Spec Grav, UA 1.025    Blood, UA neg    pH, UA 6.5    Protein, UA neg    Urobilinogen, UA negative    Nitrite, UA neg    Leukocytes, UA Trace (A) Negative  POCT UA - Microscopic Only  Result Value Ref Range   WBC, Ur, HPF, POC 5-10    RBC, urine, microscopic neg    Bacteria, U Microscopic occ    Mucus, UA neg    Epithelial cells, urine per micros occ    Crystals, Ur, HPF, POC neg    Casts, Ur, LPF, POC neg    Yeast, UA  neg   POCT Wet Prep ([place])  Result Value Ref Range   Source Wet Prep POC     WBC, Wet Prep HPF POC 5-10    Bacteria Wet Prep HPF POC Moderate (A) Few   Clue Cells Wet Prep HPF POC Few (A) None   Yeast Wet Prep HPF POC None    Trichomonas Wet Prep HPF POC none           Assessment & Plan:  1. Vaginitis and vulvovaginitis  - POCT Wet Prep [facility]) - Urine culture  2. UTI symptoms  - POCT urinalysis dipstick - POCT UA - Microscopic Only - Urine culture  3. Vaginal discharge  - Urine culture  4. Rash and nonspecific skin eruption - Probably eczema - Wound culture to r/o infection  - Dove soap - aveeno eczema lotion - avoid chlorine pool   5. BV (bacterial vaginosis)  - metroNIDAZOLE (FLAGYL) 500 MG tablet; Take 1 tablet (500 mg total) by mouth 2 (two) times daily.  Dispense: 14 tablet; Refill: 0   RTO prn   Naheem Mosco A. Chauncey Reading PA-C

## 2014-12-14 NOTE — Patient Instructions (Signed)
Bacterial Vaginosis Bacterial vaginosis is a vaginal infection that occurs when the normal balance of bacteria in the vagina is disrupted. It results from an overgrowth of certain bacteria. This is the most common vaginal infection in women of childbearing age. Treatment is important to prevent complications, especially in pregnant women, as it can cause a premature delivery. CAUSES  Bacterial vaginosis is caused by an increase in harmful bacteria that are normally present in smaller amounts in the vagina. Several different kinds of bacteria can cause bacterial vaginosis. However, the reason that the condition develops is not fully understood. RISK FACTORS Certain activities or behaviors can put you at an increased risk of developing bacterial vaginosis, including:  Having a new sex partner or multiple sex partners.  Douching.  Using an intrauterine device (IUD) for contraception. Women do not get bacterial vaginosis from toilet seats, bedding, swimming pools, or contact with objects around them. SIGNS AND SYMPTOMS  Some women with bacterial vaginosis have no signs or symptoms. Common symptoms include:  Grey vaginal discharge.  A fishlike odor with discharge, especially after sexual intercourse.  Itching or burning of the vagina and vulva.  Burning or pain with urination. DIAGNOSIS  Your health care provider will take a medical history and examine the vagina for signs of bacterial vaginosis. A sample of vaginal fluid may be taken. Your health care provider will look at this sample under a microscope to check for bacteria and abnormal cells. A vaginal pH test may also be done.  TREATMENT  Bacterial vaginosis may be treated with antibiotic medicines. These may be given in the form of a pill or a vaginal cream. A second round of antibiotics may be prescribed if the condition comes back after treatment.  HOME CARE INSTRUCTIONS   Only take over-the-counter or prescription medicines as  directed by your health care provider.  If antibiotic medicine was prescribed, take it as directed. Make sure you finish it even if you start to feel better.  Do not have sex until treatment is completed.  Tell all sexual partners that you have a vaginal infection. They should see their health care provider and be treated if they have problems, such as a mild rash or itching.  Practice safe sex by using condoms and only having one sex partner. SEEK MEDICAL CARE IF:   Your symptoms are not improving after 3 days of treatment.  You have increased discharge or pain.  You have a fever. MAKE SURE YOU:   Understand these instructions.  Will watch your condition.  Will get help right away if you are not doing well or get worse. FOR MORE INFORMATION  Centers for Disease Control and Prevention, Division of STD Prevention: www.cdc.gov/std American Sexual Health Association (ASHA): www.ashastd.org  Document Released: 06/16/2005 Document Revised: 04/06/2013 Document Reviewed: 01/26/2013 ExitCare Patient Information 2015 [employer]. This information is not intended to replace advice given to you by your health care provider. Make sure you discuss any questions you have with your health care provider.  

## 2014-12-15 LAB — URINE CULTURE

## 2014-12-16 LAB — WOUND CULTURE: Organism ID, Bacteria: NONE SEEN

## 2014-12-16 LAB — GC/CHLAMYDIA PROBE AMP
Chlamydia trachomatis, NAA: NEGATIVE
Neisseria gonorrhoeae by PCR: NEGATIVE

## 2015-01-08 ENCOUNTER — Telehealth: Payer: Self-pay | Admitting: Nurse Practitioner

## 2015-01-08 NOTE — Telephone Encounter (Signed)
Patient aware that she is not due for a physical until 08/12/2015

## 2015-03-28 ENCOUNTER — Ambulatory Visit: Payer: Medicaid Other | Admitting: Physician Assistant

## 2015-03-28 ENCOUNTER — Ambulatory Visit (INDEPENDENT_AMBULATORY_CARE_PROVIDER_SITE_OTHER): Payer: Medicaid Other | Admitting: Physician Assistant

## 2015-03-28 ENCOUNTER — Encounter: Payer: Self-pay | Admitting: Physician Assistant

## 2015-03-28 VITALS — BP 127/82 | HR 83 | Temp 97.8°F | Ht 65.5 in | Wt 121.0 lb

## 2015-03-28 DIAGNOSIS — L089 Local infection of the skin and subcutaneous tissue, unspecified: Secondary | ICD-10-CM | POA: Diagnosis not present

## 2015-03-28 DIAGNOSIS — N3000 Acute cystitis without hematuria: Secondary | ICD-10-CM | POA: Diagnosis not present

## 2015-03-28 DIAGNOSIS — H6982 Other specified disorders of Eustachian tube, left ear: Secondary | ICD-10-CM | POA: Diagnosis not present

## 2015-03-28 DIAGNOSIS — N898 Other specified noninflammatory disorders of vagina: Secondary | ICD-10-CM

## 2015-03-28 DIAGNOSIS — L298 Other pruritus: Secondary | ICD-10-CM | POA: Diagnosis not present

## 2015-03-28 LAB — POCT WET PREP (WET MOUNT)

## 2015-03-28 MED ORDER — FLUTICASONE PROPIONATE 50 MCG/ACT NA SUSP: 2.0000 | Freq: Every day | NASAL | Status: DC

## 2015-03-28 MED ORDER — CLINDAMYCIN PHOSPHATE 1 % EX GEL: Freq: Two times a day (BID) | CUTANEOUS | Status: DC

## 2015-03-28 MED ORDER — HYDROCORTISONE 2.5 % EX CREA: TOPICAL_CREAM | Freq: Two times a day (BID) | CUTANEOUS | Status: DC

## 2015-03-28 MED ORDER — NITROFURANTOIN MONOHYD MACRO 100 MG PO CAPS: 100.0000 mg | ORAL_CAPSULE | Freq: Two times a day (BID) | ORAL | Status: DC

## 2015-03-28 NOTE — Progress Notes (Signed)
   Subjective:    Patient ID: Lucious Groves, female    DOB: 08/26/1990, 24 y.o.   MRN: [id number]  HPI 24 y/o female presents with c/o rash under arm and vaginal area after shaving x 4 days. She usually changes razors after each use but used a 2nd time. Denies change in sexual partner.   She also wants to be checked for BV because she was recently treated.    Review of Systems  Constitutional: Negative.   HENT:       Ttp over left eustachian  Eyes: Negative.   Respiratory: Negative.   Cardiovascular: Negative.   Gastrointestinal: Negative.   Endocrine: Negative.   Genitourinary: Negative.   Skin:       Rash on left axilla and vagina after shaving.        Objective:   Physical Exam  Skin:  Papules under left axilla and on external labia - appears to be d/t razor burn     Results for orders placed or performed in visit on 03/28/15  POCT Wet Prep [facility])  Result Value Ref Range   Source Wet Prep POC vaginal    WBC, Wet Prep HPF POC 1-3    Bacteria Wet Prep HPF POC Moderate (A) None, Few   Clue Cells Wet Prep HPF POC None None   Yeast Wet Prep HPF POC None    KOH Wet Prep POC     Trichomonas Wet Prep HPF POC none        Assessment & Plan:  1. Vaginal itching  - POCT Wet Prep [facility]) - hydrocortisone 2.5 % cream; Apply topically 2 (two) times daily.  Dispense: 30 g; Refill: 0 - Aerobic culture  2. Skin infection  - hydrocortisone 2.5 % cream; Apply topically 2 (two) times daily.  Dispense: 30 g; Refill: 0 - Aerobic culture - clindamycin (CLINDAGEL) 1 % gel; Apply topically 2 (two) times daily. To AA of skin  Dispense: 30 g; Refill: 1  3. Eustachian tube dysfunction, left  - fluticasone (FLONASE) 50 MCG/ACT nasal spray; Place 2 sprays into both nostrils daily.  Dispense: 16 g; Refill: 6  4. Acute cystitis without hematuria  - nitrofurantoin, macrocrystal-monohydrate, (MACROBID) 100 MG capsule; Take 1 capsule (100 mg total) by mouth 2 (two) times  daily.  Dispense: 20 capsule; Refill: 0   Dove Sensitive skin soap   RTO 2 weeks if no improvement   Tiffany A. Chauncey Reading PA-C

## 2015-03-31 LAB — AEROBIC CULTURE

## 2015-04-02 ENCOUNTER — Other Ambulatory Visit: Payer: Self-pay | Admitting: Physician Assistant

## 2015-04-02 MED ORDER — SULFAMETHOXAZOLE-TRIMETHOPRIM 800-160 MG PO TABS: 1.0000 | ORAL_TABLET | Freq: Every day | ORAL | Status: DC

## 2015-04-04 ENCOUNTER — Telehealth: Payer: Self-pay | Admitting: Family Medicine

## 2015-04-04 NOTE — Telephone Encounter (Signed)
Stp and reviewed labs. 

## 2015-04-24 ENCOUNTER — Ambulatory Visit (INDEPENDENT_AMBULATORY_CARE_PROVIDER_SITE_OTHER): Payer: Medicaid Other | Admitting: Family

## 2015-04-24 ENCOUNTER — Encounter: Payer: Self-pay | Admitting: Family

## 2015-04-24 VITALS — BP 135/82 | HR 72 | Temp 97.7°F | Ht 65.5 in | Wt 122.0 lb

## 2015-04-24 DIAGNOSIS — B958 Unspecified staphylococcus as the cause of diseases classified elsewhere: Secondary | ICD-10-CM | POA: Diagnosis not present

## 2015-04-24 DIAGNOSIS — N3000 Acute cystitis without hematuria: Secondary | ICD-10-CM

## 2015-04-24 DIAGNOSIS — Z0189 Encounter for other specified special examinations: Secondary | ICD-10-CM

## 2015-04-24 DIAGNOSIS — L089 Local infection of the skin and subcutaneous tissue, unspecified: Secondary | ICD-10-CM | POA: Diagnosis not present

## 2015-04-24 LAB — POCT URINALYSIS DIPSTICK
BILIRUBIN UA: NEGATIVE
GLUCOSE UA: NEGATIVE
KETONES UA: NEGATIVE
Leukocytes, UA: NEGATIVE
Nitrite, UA: NEGATIVE
Protein, UA: NEGATIVE
RBC UA: NEGATIVE
SPEC GRAV UA: 1.015
Urobilinogen, UA: NEGATIVE
pH, UA: 6.5

## 2015-04-24 LAB — POCT UA - MICROSCOPIC ONLY
CASTS, UR, LPF, POC: NEGATIVE
CRYSTALS, UR, HPF, POC: NEGATIVE
Mucus, UA: NEGATIVE
RBC, urine, microscopic: NEGATIVE
YEAST UA: NEGATIVE

## 2015-04-24 MED ORDER — SULFAMETHOXAZOLE-TRIMETHOPRIM 800-160 MG PO TABS: 1.0000 | ORAL_TABLET | Freq: Two times a day (BID) | ORAL | Status: DC

## 2015-04-24 NOTE — Progress Notes (Signed)
   Subjective:    Patient ID: Lucious GrovesKathy Sue Lowery, female    DOB: August 30, 1990, 24 y.o.   MRN: [id number]  HPI Pt presents to the office for a staph infection above her vagina. Pt was seen in the office on 03/28/15 and given Macrobid for a UTI and given  Clindamycin gel. Pt states she could not pick up the clindamycin gel because of price, but states she finished the Macrobid. Pt states her labia is erythemas, warm, and peeling. Pt denies any fever or drainage.    Review of Systems  Constitutional: Negative.   HENT: Negative.   Eyes: Negative.   Respiratory: Negative.  Negative for shortness of breath.   Cardiovascular: Negative.  Negative for palpitations.  Gastrointestinal: Negative.   Endocrine: Negative.   Genitourinary: Negative.   Musculoskeletal: Negative.   Neurological: Negative.  Negative for headaches.  Hematological: Negative.   Psychiatric/Behavioral: Negative.   All other systems reviewed and are negative.      Objective:   Physical Exam  Constitutional: She is oriented to person, place, and time. She appears well-developed and well-nourished. No distress.  HENT:  Head: Normocephalic and atraumatic.  Eyes: Pupils are equal, round, and reactive to light.  Neck: Normal range of motion. Neck supple. No thyromegaly present.  Cardiovascular: Normal rate, regular rhythm, normal heart sounds and intact distal pulses.   No murmur heard. Pulmonary/Chest: Effort normal and breath sounds normal. No respiratory distress. She has no wheezes.  Abdominal: Soft. Bowel sounds are normal. She exhibits no distension. There is no tenderness.  Genitourinary:  Vagina area slightly erythemas with mild dry skin   Musculoskeletal: Normal range of motion. She exhibits no edema or tenderness.  Neurological: She is alert and oriented to person, place, and time. She has normal reflexes. No cranial nerve deficit.  Skin: Skin is warm and dry.  Psychiatric: She has a normal mood and affect. Her  behavior is normal. Judgment and thought content normal.  Vitals reviewed.  BP 135/82 mmHg  Pulse 72  Temp(Src) 97.7 F (36.5 C) (Oral)  Ht 5' 5.5" (1.664 m)  Wt 122 lb (55.339 kg)  BMI 19.99 kg/m2  LMP 03/14/2015 (Approximate)       Assessment & Plan:  1. Visit for urine test - POCT urinalysis dipstick - POCT UA - Microscopic Only  2. Acute cystitis without hematuria -Force fluids AZO over the counter X2 days RTO prn - sulfamethoxazole-trimethoprim (BACTRIM DS,SEPTRA DS) 800-160 MG tablet; Take 1 tablet by mouth 2 (two) times daily.  Dispense: 20 tablet; Refill: 0  3. Staph skin infection -Good hand hygiene Complete antibiotic RTO prn - sulfamethoxazole-trimethoprim (BACTRIM DS,SEPTRA DS) 800-160 MG tablet; Take 1 tablet by mouth 2 (two) times daily.  Dispense: 20 tablet; Refill: 0  Jannifer Rodneyhristy Clinton Wahlberg, FNP

## 2015-04-24 NOTE — Patient Instructions (Signed)

## 2015-04-24 NOTE — Addendum Note (Signed)
Addended by: Tommas OlpHANDY, Demetrice Combes N on: 04/24/2015 05:48 PM   Modules accepted: Orders

## 2015-04-26 LAB — URINE CULTURE

## 2015-05-15 ENCOUNTER — Ambulatory Visit (INDEPENDENT_AMBULATORY_CARE_PROVIDER_SITE_OTHER): Payer: Medicaid Other | Admitting: Family Medicine

## 2015-05-15 ENCOUNTER — Encounter: Payer: Self-pay | Admitting: Family Medicine

## 2015-05-15 ENCOUNTER — Ambulatory Visit (INDEPENDENT_AMBULATORY_CARE_PROVIDER_SITE_OTHER): Payer: Medicaid Other

## 2015-05-15 VITALS — BP 108/76 | HR 79 | Temp 98.8°F | Ht 65.5 in | Wt 122.8 lb

## 2015-05-15 DIAGNOSIS — K5281 Eosinophilic gastritis or gastroenteritis: Secondary | ICD-10-CM | POA: Diagnosis not present

## 2015-05-15 DIAGNOSIS — R1084 Generalized abdominal pain: Secondary | ICD-10-CM

## 2015-05-15 DIAGNOSIS — L2081 Atopic neurodermatitis: Secondary | ICD-10-CM

## 2015-05-15 LAB — POCT URINALYSIS DIPSTICK
Bilirubin, UA: NEGATIVE
Glucose, UA: NEGATIVE
Ketones, UA: NEGATIVE
LEUKOCYTES UA: NEGATIVE
NITRITE UA: NEGATIVE
PH UA: 7
PROTEIN UA: NEGATIVE
RBC UA: NEGATIVE
Spec Grav, UA: 1.01
UROBILINOGEN UA: NEGATIVE

## 2015-05-15 LAB — POCT URINE PREGNANCY: PREG TEST UR: NEGATIVE

## 2015-05-15 LAB — POCT UA - MICROSCOPIC ONLY
BACTERIA, U MICROSCOPIC: NEGATIVE
Casts, Ur, LPF, POC: NEGATIVE
Crystals, Ur, HPF, POC: NEGATIVE
RBC, URINE, MICROSCOPIC: NEGATIVE
Yeast, UA: NEGATIVE

## 2015-05-15 MED ORDER — METRONIDAZOLE 500 MG PO TABS: 500.0000 mg | ORAL_TABLET | Freq: Three times a day (TID) | ORAL | Status: DC

## 2015-05-15 MED ORDER — FLUOCINONIDE-E 0.05 % EX CREA: 1.0000 "application " | TOPICAL_CREAM | Freq: Two times a day (BID) | CUTANEOUS | Status: DC

## 2015-05-15 NOTE — Progress Notes (Signed)
Subjective:  Patient ID: Meghan Guerrero, female    DOB: 05-02-1991  Age: 24 y.o. MRN: [id number]  CC: GI upset   HPI Meghan Guerrero presents for fever with chills, vomiting 3-4X/ day. Can't sleep. 2-3 watery BM. Onset 3 days ago. Increasing in severity. She is having crampy abdominal pain intermittently. Appetite has been poor. Fever has been subjective. No known exposure.  History Meghan Guerrero has a past medical history of Migraine.   She has no past surgical history on file.   Her family history is not on file.She reports that she has been smoking Cigarettes.  She has been smoking about 0.50 packs per day. She has never used smokeless tobacco. She reports that she drinks alcohol. She reports that she does not use illicit drugs.  Outpatient Prescriptions Prior to Visit  Medication Sig Dispense Refill  . fluticasone (FLONASE) 50 MCG/ACT nasal spray PLACE 2 SPRAYS INTO BOTH NOSTRILS DAILY.  6  . hydrocortisone 2.5 % cream Apply topically 2 (two) times daily. 30 g 0  . ibuprofen (ADVIL,MOTRIN) 200 MG tablet Take 800 mg by mouth every 6 (six) hours as needed for moderate pain.    . norgestimate-ethinyl estradiol (ORTHO-CYCLEN, 28,) 0.25-35 MG-MCG tablet Take 1 tablet by mouth daily. 1 Package 11  . sulfamethoxazole-trimethoprim (BACTRIM DS,SEPTRA DS) 800-160 MG tablet Take 1 tablet by mouth 2 (two) times daily. (Patient not taking: Reported on 05/15/2015) 20 tablet 0   No facility-administered medications prior to visit.    ROS Review of Systems  Constitutional: Positive for activity change (decreased) and appetite change (decreased). Negative for chills, diaphoresis and fatigue.  HENT: Negative for congestion and rhinorrhea.   Respiratory: Negative for cough and shortness of breath.   Cardiovascular: Negative for chest pain and palpitations.  Gastrointestinal: Positive for nausea, vomiting, abdominal pain, diarrhea and abdominal distention. Negative for constipation.  Genitourinary:  Positive for flank pain. Negative for dysuria and frequency.  Musculoskeletal: Negative for myalgias and arthralgias.  Skin: Positive for rash (neck and arms. Sometimes thighs as well.). Negative for wound.  All other systems reviewed and are negative.   Objective:  BP 108/76 mmHg  Pulse 79  Temp(Src) 98.8 F (37.1 C) (Oral)  Ht 5' 5.5" (1.664 m)  Wt 122 lb 12.8 oz (55.702 kg)  BMI 20.12 kg/m2  SpO2 100%  LMP 05/14/2015  BP Readings from Last 3 Encounters:  05/15/15 108/76  04/24/15 135/82  03/28/15 127/82    Wt Readings from Last 3 Encounters:  05/15/15 122 lb 12.8 oz (55.702 kg)  04/24/15 122 lb (55.339 kg)  03/28/15 121 lb (54.885 kg)     Physical Exam  Constitutional: She is oriented to person, place, and time. She appears well-developed and well-nourished.  HENT:  Head: Normocephalic and atraumatic.  Cardiovascular: Normal rate and regular rhythm.   No murmur heard. Pulmonary/Chest: Effort normal and breath sounds normal.  Abdominal: Soft. Bowel sounds are normal. She exhibits no mass. There is tenderness. There is no rebound and no guarding.  Neurological: She is alert and oriented to person, place, and time.  Skin: Skin is warm and dry. Rash (mild maculopapular erythema around the elbows anhe neck) noted.  Psychiatric: She has a normal mood and affect. Her behavior is normal.    No results found for: HGBA1C  Lab Results  Component Value Date   WBC 12.2* 06/10/2013   HGB 14.1 06/10/2013   HCT 42.4 06/10/2013   PLT 203 06/10/2013   GLUCOSE 83 06/10/2013   ALT  11 06/10/2013   AST 15 06/10/2013   NA 139 06/10/2013   K 4.0 06/10/2013   CL 104 06/10/2013   CREATININE 0.64 06/10/2013   BUN 11 06/10/2013   CO2 26 06/10/2013   Results for orders placed or performed in visit on 05/15/15  POCT urine pregnancy  Result Value Ref Range   Preg Test, Ur Negative Negative  POCT urinalysis dipstick  Result Value Ref Range   Color, UA straw    Clarity, UA  cloudy    Glucose, UA neg    Bilirubin, UA neg    Ketones, UA neg    Spec Grav, UA 1.010    Blood, UA neg    pH, UA 7.0    Protein, UA neg    Urobilinogen, UA negative    Nitrite, UA neg    Leukocytes, UA Negative Negative  POCT UA - Microscopic Only  Result Value Ref Range   WBC, Ur, HPF, POC rare    RBC, urine, microscopic neg    Bacteria, U Microscopic neg    Mucus, UA occ    Epithelial cells, urine per micros moderate    Crystals, Ur, HPF, POC neg    Casts, Ur, LPF, POC neg    Yeast, UA neg    Dg Chest 2 View  06/10/2013  CLINICAL DATA:  Cough with weakness. EXAM: CHEST  2 VIEW COMPARISON:  None. FINDINGS: The heart size and mediastinal contours are within normal limits. Both lungs are clear. The visualized skeletal structures are unremarkable. IMPRESSION: No active cardiopulmonary disease. Electronically Signed   By: Rolla Flatten M.D.   On: 06/10/2013 21:15    Assessment & Plan:   Karrah was seen today for gi upset.  Diagnoses and all orders for this visit:  Generalized abdominal pain -     CBC with Differential/Platelet -     CMP14+EGFR -     POCT urine pregnancy -     POCT urinalysis dipstick -     POCT UA - Microscopic Only -     DG Abd 2 Views; Future  Eosinophilic gastroenteritis  Atopic neurodermatitis  Other orders -     fluocinonide-emollient (LIDEX-E) 0.05 % cream; Apply 1 application topically 2 (two) times daily. To rash, itching areas -     metroNIDAZOLE (FLAGYL) 500 MG tablet; Take 1 tablet (500 mg total) by mouth 3 (three) times daily.   I have discontinued Ms. Lowery's sulfamethoxazole-trimethoprim. I am also having her start on fluocinonide-emollient and metroNIDAZOLE. Additionally, I am having her maintain her ibuprofen, norgestimate-ethinyl estradiol, hydrocortisone, and fluticasone.  Meds ordered this encounter  Medications  . fluocinonide-emollient (LIDEX-E) 0.05 % cream    Sig: Apply 1 application topically 2 (two) times daily. To rash,  itching areas    Dispense:  60 g    Refill:  2  . metroNIDAZOLE (FLAGYL) 500 MG tablet    Sig: Take 1 tablet (500 mg total) by mouth 3 (three) times daily.    Dispense:  30 tablet    Refill:  0     Follow-up: Return if symptoms worsen or fail to improve.  Claretta Fraise, M.D.

## 2015-05-17 ENCOUNTER — Telehealth: Payer: Self-pay | Admitting: Family Medicine

## 2015-05-31 NOTE — Telephone Encounter (Signed)
Unable to contact after multiple attempts 

## 2015-07-19 ENCOUNTER — Ambulatory Visit (INDEPENDENT_AMBULATORY_CARE_PROVIDER_SITE_OTHER): Payer: Medicaid Other | Admitting: Nurse Practitioner

## 2015-07-19 VITALS — BP 114/72 | HR 81 | Temp 97.0°F | Ht 65.5 in | Wt 124.0 lb

## 2015-07-19 DIAGNOSIS — R599 Enlarged lymph nodes, unspecified: Secondary | ICD-10-CM | POA: Diagnosis not present

## 2015-07-19 DIAGNOSIS — N3000 Acute cystitis without hematuria: Secondary | ICD-10-CM | POA: Diagnosis not present

## 2015-07-19 DIAGNOSIS — R3915 Urgency of urination: Secondary | ICD-10-CM

## 2015-07-19 DIAGNOSIS — R591 Generalized enlarged lymph nodes: Secondary | ICD-10-CM

## 2015-07-19 LAB — POCT URINALYSIS DIPSTICK
Bilirubin, UA: NEGATIVE
Glucose, UA: NEGATIVE
Ketones, UA: NEGATIVE
Nitrite, UA: NEGATIVE
PH UA: 5
PROTEIN UA: NEGATIVE
RBC UA: NEGATIVE
SPEC GRAV UA: 1.025
UROBILINOGEN UA: NEGATIVE

## 2015-07-19 LAB — POCT UA - MICROSCOPIC ONLY
CRYSTALS, UR, HPF, POC: NEGATIVE
Casts, Ur, LPF, POC: NEGATIVE
Mucus, UA: NEGATIVE
YEAST UA: NEGATIVE

## 2015-07-19 MED ORDER — CIPROFLOXACIN HCL 500 MG PO TABS: 500.0000 mg | ORAL_TABLET | Freq: Two times a day (BID) | ORAL | Status: DC

## 2015-07-19 NOTE — Patient Instructions (Signed)

## 2015-07-19 NOTE — Progress Notes (Signed)
   Subjective:    Patient ID: Meghan Guerrero, female    DOB: 21-Jul-1990, 25 y.o.   MRN: [id number]  HPI Patient comes in with 2 complaints: - Right ear pain with knot just below ear- has been there for over a week- no fever- no sore throat - urinary urgency with scant amounts of urine- denies dysuria    Review of Systems  Constitutional: Negative.   HENT: Negative.   Respiratory: Negative.   Cardiovascular: Negative.   Genitourinary: Positive for urgency and frequency. Negative for dysuria.  Neurological: Negative.   Psychiatric/Behavioral: Negative.   All other systems reviewed and are negative.      Objective:   Physical Exam  Constitutional: She appears well-developed and well-nourished.  HENT:  Right Ear: Hearing, tympanic membrane, external ear and ear canal normal.  Left Ear: Hearing, tympanic membrane, external ear and ear canal normal.  Nose: Mucosal edema and rhinorrhea present. Right sinus exhibits no maxillary sinus tenderness and no frontal sinus tenderness. Left sinus exhibits no maxillary sinus tenderness and no frontal sinus tenderness.  Mouth/Throat: Uvula is midline, oropharynx is clear and moist and mucous membranes are normal.  Palpable tender nodule anterior to right tragus  Neck:  Anterior cervical tender lymphadenopathy right  Cardiovascular: Normal rate, regular rhythm and normal heart sounds.   Pulmonary/Chest: Effort normal.  Abdominal: Soft. She exhibits no distension and no mass. There is no tenderness. There is no rebound and no guarding.  Genitourinary:  No CVA tenderness  Skin: Skin is warm.  Psychiatric: She has a normal mood and affect. Her behavior is normal. Judgment and thought content normal.    BP 114/72 mmHg  Pulse 81  Temp(Src) 97 F (36.1 C) (Oral)  Ht 5' 5.5" (1.664 m)  Wt 124 lb (56.246 kg)  BMI 20.31 kg/m2  Results for orders placed or performed in visit on 07/19/15  POCT UA - Microscopic Only  Result Value Ref Range   WBC, Ur, HPF, POC 5-10    RBC, urine, microscopic occ    Bacteria, U Microscopic mod    Mucus, UA neg    Epithelial cells, urine per micros few    Crystals, Ur, HPF, POC neg    Casts, Ur, LPF, POC neg    Yeast, UA neg   POCT urinalysis dipstick  Result Value Ref Range   Color, UA gold    Clarity, UA clear    Glucose, UA neg    Bilirubin, UA neg    Ketones, UA neg    Spec Grav, UA 1.025    Blood, UA neg    pH, UA 5.0    Protein, UA neg    Urobilinogen, UA negative    Nitrite, UA neg    Leukocytes, UA small (1+) (A) Negative        Assessment & Plan:   1. Urinary urgency   2. Acute cystitis without hematuria   3. Lymphadenopathy of head and neck    Meds ordered this encounter  Medications  . ciprofloxacin (CIPRO) 500 MG tablet    Sig: Take 1 tablet (500 mg total) by mouth 2 (two) times daily.    Dispense:  20 tablet    Refill:  0    Order Specific Question:  Supervising Provider    Answer:  Ernestina Penna [[id number]]   Force fluids RTO prn  Mary-Margaret Daphine Deutscher, FNP

## 2015-07-21 LAB — URINE CULTURE

## 2015-09-03 ENCOUNTER — Other Ambulatory Visit: Payer: Self-pay | Admitting: Nurse Practitioner

## 2015-10-18 ENCOUNTER — Other Ambulatory Visit: Payer: Self-pay | Admitting: Family

## 2015-10-18 ENCOUNTER — Encounter: Payer: Self-pay | Admitting: Family

## 2015-10-18 ENCOUNTER — Ambulatory Visit (INDEPENDENT_AMBULATORY_CARE_PROVIDER_SITE_OTHER): Payer: Medicaid Other | Admitting: Family

## 2015-10-18 VITALS — BP 112/79 | HR 85 | Temp 97.7°F | Ht 65.5 in | Wt 126.8 lb

## 2015-10-18 DIAGNOSIS — F411 Generalized anxiety disorder: Secondary | ICD-10-CM | POA: Diagnosis not present

## 2015-10-18 DIAGNOSIS — R34 Anuria and oliguria: Secondary | ICD-10-CM

## 2015-10-18 LAB — URINALYSIS, COMPLETE
Bilirubin, UA: NEGATIVE
GLUCOSE, UA: NEGATIVE
Leukocytes, UA: NEGATIVE
NITRITE UA: NEGATIVE
PH UA: 8 — AB (ref 5.0–7.5)
Protein, UA: NEGATIVE
RBC, UA: NEGATIVE
Specific Gravity, UA: 1.02 (ref 1.005–1.030)
UUROB: 0.2 mg/dL (ref 0.2–1.0)

## 2015-10-18 LAB — MICROSCOPIC EXAMINATION
Bacteria, UA: NONE SEEN
WBC UA: NONE SEEN /HPF (ref 0–?)

## 2015-10-18 LAB — WET PREP FOR TRICH, YEAST, CLUE
Clue Cell Exam: NEGATIVE
TRICHOMONAS EXAM: NEGATIVE
YEAST EXAM: NEGATIVE

## 2015-10-18 MED ORDER — METRONIDAZOLE 500 MG PO TABS: 500.0000 mg | ORAL_TABLET | Freq: Two times a day (BID) | ORAL | Status: DC

## 2015-10-18 MED ORDER — HYDROXYZINE PAMOATE 100 MG PO CAPS: 100.0000 mg | ORAL_CAPSULE | Freq: Three times a day (TID) | ORAL | Status: DC | PRN

## 2015-10-18 NOTE — Progress Notes (Signed)
Patient aware.

## 2015-10-18 NOTE — Progress Notes (Signed)
   Subjective:    Patient ID: Meghan Guerrero, female    DOB: August 09, 1990, 25 y.o.   MRN: [id number]  Dysuria  This is a new problem. The current episode started in the past 7 days. The problem occurs intermittently. The problem has been unchanged. The patient is experiencing no pain. She is sexually active. Associated symptoms include frequency (pressure), hesitancy and urgency. Pertinent negatives include no discharge or hematuria. She has tried increased fluids for the symptoms. The treatment provided mild relief.  Anxiety Presents for follow-up visit. Onset was 1 to 5 years ago. The problem has been waxing and waning. Symptoms include excessive worry, irritability, muscle tension, nervous/anxious behavior and restlessness. Patient reports no palpitations, panic or shortness of breath. Symptoms occur most days. The severity of symptoms is moderate.   Her past medical history is significant for anxiety/panic attacks. Past treatments include nothing. The treatment provided no relief.      Review of Systems  Constitutional: Positive for irritability.  HENT: Negative.   Eyes: Negative.   Respiratory: Negative.  Negative for shortness of breath.   Cardiovascular: Negative.  Negative for palpitations.  Gastrointestinal: Negative.   Endocrine: Negative.   Genitourinary: Positive for dysuria, hesitancy, urgency and frequency (pressure). Negative for hematuria.  Musculoskeletal: Negative.   Neurological: Negative.  Negative for headaches.  Hematological: Negative.   Psychiatric/Behavioral: The patient is nervous/anxious.   All other systems reviewed and are negative.      Objective:   Physical Exam  Constitutional: She is oriented to person, place, and time. She appears well-developed and well-nourished. No distress.  HENT:  Head: Normocephalic and atraumatic.  Eyes: Pupils are equal, round, and reactive to light.  Neck: Normal range of motion. Neck supple. No thyromegaly present.    Cardiovascular: Normal rate, regular rhythm, normal heart sounds and intact distal pulses.   No murmur heard. Pulmonary/Chest: Effort normal and breath sounds normal. No respiratory distress. She has no wheezes.  Abdominal: Soft. Bowel sounds are normal. She exhibits no distension. There is no tenderness.  Musculoskeletal: Normal range of motion. She exhibits no edema or tenderness.  Negative for CVA tenderness   Neurological: She is alert and oriented to person, place, and time. She has normal reflexes. No cranial nerve deficit.  Skin: Skin is warm and dry.  Psychiatric: She has a normal mood and affect. Her behavior is normal. Judgment and thought content normal.  Vitals reviewed.   BP 112/79 mmHg  Pulse 85  Temp(Src) 97.7 F (36.5 C) (Oral)  Ht 5' 5.5" (1.664 m)  Wt 126 lb 12.8 oz (57.516 kg)  BMI 20.77 kg/m2       Assessment & Plan:  1. Scanty urine -Force fluids AZO over the counter X2 days RTO prn - Urinalysis, Complete - WET PREP FOR TRICH, YEAST, CLUE  2. GAD (generalized anxiety disorder) -Pt started on Vistaril today- Told pt I could not order her xanan or klonopin today -Stress management discussed -RTO prn  - hydrOXYzine (VISTARIL) 100 MG capsule; Take 1 capsule (100 mg total) by mouth 3 (three) times daily as needed for itching.  Dispense: 60 capsule; Refill: 2  Jannifer Rodneyhristy Klaira Pesci, FNP

## 2015-10-18 NOTE — Patient Instructions (Signed)
Generalized Anxiety Disorder Generalized anxiety disorder (GAD) is a mental disorder. It interferes with life functions, including relationships, work, and school. GAD is different from normal anxiety, which everyone experiences at some point in their lives in response to specific life events and activities. Normal anxiety actually helps us prepare for and get through these life events and activities. Normal anxiety goes away after the event or activity is over.  GAD causes anxiety that is not necessarily related to specific events or activities. It also causes excess anxiety in proportion to specific events or activities. The anxiety associated with GAD is also difficult to control. GAD can vary from mild to severe. People with severe GAD can have intense waves of anxiety with physical symptoms (panic attacks).  SYMPTOMS The anxiety and worry associated with GAD are difficult to control. This anxiety and worry are related to many life events and activities and also occur more days than not for 6 months or longer. People with GAD also have three or more of the following symptoms (one or more in children):  Restlessness.   Fatigue.  Difficulty concentrating.   Irritability.  Muscle tension.  Difficulty sleeping or unsatisfying sleep. DIAGNOSIS GAD is diagnosed through an assessment by your health care provider. Your health care provider will ask you questions aboutyour mood,physical symptoms, and events in your life. Your health care provider may ask you about your medical history and use of alcohol or drugs, including prescription medicines. Your health care provider may also do a physical exam and blood tests. Certain medical conditions and the use of certain substances can cause symptoms similar to those associated with GAD. Your health care provider may refer you to a mental health specialist for further evaluation. TREATMENT The following therapies are usually used to treat GAD:    Medication. Antidepressant medication usually is prescribed for long-term daily control. Antianxiety medicines may be added in severe cases, especially when panic attacks occur.   Talk therapy (psychotherapy). Certain types of talk therapy can be helpful in treating GAD by providing support, education, and guidance. A form of talk therapy called cognitive behavioral therapy can teach you healthy ways to think about and react to daily life events and activities.  Stress managementtechniques. These include yoga, meditation, and exercise and can be very helpful when they are practiced regularly. A mental health specialist can help determine which treatment is best for you. Some people see improvement with one therapy. However, other people require a combination of therapies.   This information is not intended to replace advice given to you by your health care provider. Make sure you discuss any questions you have with your health care provider.   Document Released: 10/11/2012 Document Revised: 07/07/2014 Document Reviewed: 10/11/2012 Elsevier Interactive Patient Education 2016 [employer] Inc.  

## 2015-11-19 ENCOUNTER — Ambulatory Visit (INDEPENDENT_AMBULATORY_CARE_PROVIDER_SITE_OTHER): Payer: Medicaid Other | Admitting: Physician Assistant

## 2015-11-19 ENCOUNTER — Encounter: Payer: Self-pay | Admitting: Physician Assistant

## 2015-11-19 VITALS — BP 116/76 | HR 80 | Temp 98.0°F | Ht 65.5 in | Wt 133.6 lb

## 2015-11-19 DIAGNOSIS — J069 Acute upper respiratory infection, unspecified: Secondary | ICD-10-CM | POA: Diagnosis not present

## 2015-11-19 MED ORDER — AZITHROMYCIN 250 MG PO TABS: ORAL_TABLET | ORAL | Status: DC

## 2015-11-19 NOTE — Progress Notes (Signed)
Subjective:     Patient ID: Meghan GrovesKathy Sue Lowery, female   DOB: 11-27-1990, 25 y.o.   MRN: [id number]  HPI Pt with cough x 1 month Recently cough has become productive of a yellow sputum Using multiple OTC meds w/o relief  Review of Systems  Constitutional: Positive for fatigue. Negative for chills, activity change and appetite change.  HENT: Positive for congestion, postnasal drip and sinus pressure. Negative for ear discharge, ear pain, rhinorrhea, sneezing and sore throat.   Respiratory: Positive for cough. Negative for chest tightness, shortness of breath and wheezing.   Cardiovascular: Negative.        Objective:   Physical Exam  Constitutional: She appears well-developed and well-nourished.  HENT:  Right Ear: External ear normal.  Left Ear: External ear normal.  Mouth/Throat: Oropharynx is clear and moist.  Neck: Neck supple.  Cardiovascular: Normal rate, regular rhythm and normal heart sounds.   No murmur heard. Pulmonary/Chest: Effort normal and breath sounds normal. No respiratory distress. She has no wheezes.  Lymphadenopathy:    She has no cervical adenopathy.  Nursing note and vitals reviewed.      Assessment:     1. Acute upper respiratory infection        Plan:     Fluids Rest Smoking cessation Zpack F/U prn At this point pt wanted to discuss her troubles sleeping Usual pt of Jannifer RodneyChristy Hawks and recently tried on Hydroxyzine States this has not helped at all Klonopin has worked well in the past  Informed I would have the nurse send a note to her regarding the sleep issues

## 2015-11-19 NOTE — Patient Instructions (Signed)
Upper Respiratory Infection, Adult Most upper respiratory infections (URIs) are a viral infection of the air passages leading to the lungs. A URI affects the nose, throat, and upper air passages. The most common type of URI is nasopharyngitis and is typically referred to as "the common cold." URIs run their course and usually go away on their own. Most of the time, a URI does not require medical attention, but sometimes a bacterial infection in the upper airways can follow a viral infection. This is called a secondary infection. Sinus and middle ear infections are common types of secondary upper respiratory infections. Bacterial pneumonia can also complicate a URI. A URI can worsen asthma and chronic obstructive pulmonary disease (COPD). Sometimes, these complications can require emergency medical care and may be life threatening.  CAUSES Almost all URIs are caused by viruses. A virus is a type of germ and can spread from one person to another.  RISKS FACTORS You may be at risk for a URI if:   You smoke.   You have chronic heart or lung disease.  You have a weakened defense (immune) system.   You are very young or very old.   You have nasal allergies or asthma.  You work in crowded or poorly ventilated areas.  You work in health care facilities or schools. SIGNS AND SYMPTOMS  Symptoms typically develop 2-3 days after you come in contact with a cold virus. Most viral URIs last 7-10 days. However, viral URIs from the influenza virus (flu virus) can last 14-18 days and are typically more severe. Symptoms may include:   Runny or stuffy (congested) nose.   Sneezing.   Cough.   Sore throat.   Headache.   Fatigue.   Fever.   Loss of appetite.   Pain in your forehead, behind your eyes, and over your cheekbones (sinus pain).  Muscle aches.  DIAGNOSIS  Your health care provider may diagnose a URI by:  Physical exam.  Tests to check that your symptoms are not due to  another condition such as:  Strep throat.  Sinusitis.  Pneumonia.  Asthma. TREATMENT  A URI goes away on its own with time. It cannot be cured with medicines, but medicines may be prescribed or recommended to relieve symptoms. Medicines may help:  Reduce your fever.  Reduce your cough.  Relieve nasal congestion. HOME CARE INSTRUCTIONS   Take medicines only as directed by your health care provider.   Gargle warm saltwater or take cough drops to comfort your throat as directed by your health care provider.  Use a warm mist humidifier or inhale steam from a shower to increase air moisture. This may make it easier to breathe.  Drink enough fluid to keep your urine clear or pale yellow.   Eat soups and other clear broths and maintain good nutrition.   Rest as needed.   Return to work when your temperature has returned to normal or as your health care provider advises. You may need to stay home longer to avoid infecting others. You can also use a face mask and careful hand washing to prevent spread of the virus.  Increase the usage of your inhaler if you have asthma.   Do not use any tobacco products, including cigarettes, chewing tobacco, or electronic cigarettes. If you need help quitting, ask your health care provider. PREVENTION  The best way to protect yourself from getting a cold is to practice good hygiene.   Avoid oral or hand contact with people with cold   symptoms.   Wash your hands often if contact occurs.  There is no clear evidence that vitamin C, vitamin E, echinacea, or exercise reduces the chance of developing a cold. However, it is always recommended to get plenty of rest, exercise, and practice good nutrition.  SEEK MEDICAL CARE IF:   You are getting worse rather than better.   Your symptoms are not controlled by medicine.   You have chills.  You have worsening shortness of breath.  You have brown or red mucus.  You have yellow or brown nasal  discharge.  You have pain in your face, especially when you bend forward.  You have a fever.  You have swollen neck glands.  You have pain while swallowing.  You have white areas in the back of your throat. SEEK IMMEDIATE MEDICAL CARE IF:   You have severe or persistent:  Headache.  Ear pain.  Sinus pain.  Chest pain.  You have chronic lung disease and any of the following:  Wheezing.  Prolonged cough.  Coughing up blood.  A change in your usual mucus.  You have a stiff neck.  You have changes in your:  Vision.  Hearing.  Thinking.  Mood. MAKE SURE YOU:   Understand these instructions.  Will watch your condition.  Will get help right away if you are not doing well or get worse.   This information is not intended to replace advice given to you by your health care provider. Make sure you discuss any questions you have with your health care provider.   Document Released: 12/10/2000 Document Revised: 10/31/2014 Document Reviewed: 09/21/2013 Elsevier Interactive Patient Education 2016 [employer] Inc.  

## 2015-11-30 ENCOUNTER — Encounter: Payer: Medicaid Other | Admitting: Family

## 2015-12-04 ENCOUNTER — Other Ambulatory Visit: Payer: Self-pay | Admitting: Family Medicine

## 2015-12-04 ENCOUNTER — Telehealth: Payer: Self-pay | Admitting: Family Medicine

## 2015-12-04 NOTE — Telephone Encounter (Signed)
Can not prescribe any anxiety medication at this time. PT needs to make appt with her PCP

## 2015-12-04 NOTE — Telephone Encounter (Signed)
Patient states that the hydroxyzine is not helping. She states that she is taking 2-4 of the 100mg  right before bed and they are not helping. Please advise

## 2015-12-05 NOTE — Telephone Encounter (Signed)
lmovm NTBS by PCP to make any changes to this type of medication

## 2016-04-08 IMAGING — CR DG ABDOMEN 2V
2 series · 2 of 2 positions shown · non-contrast
Comparison: None.

CLINICAL DATA: Vomiting and diarrhea

EXAM:
ABDOMEN - 2 VIEW

[view not recorded (1 of 2)]
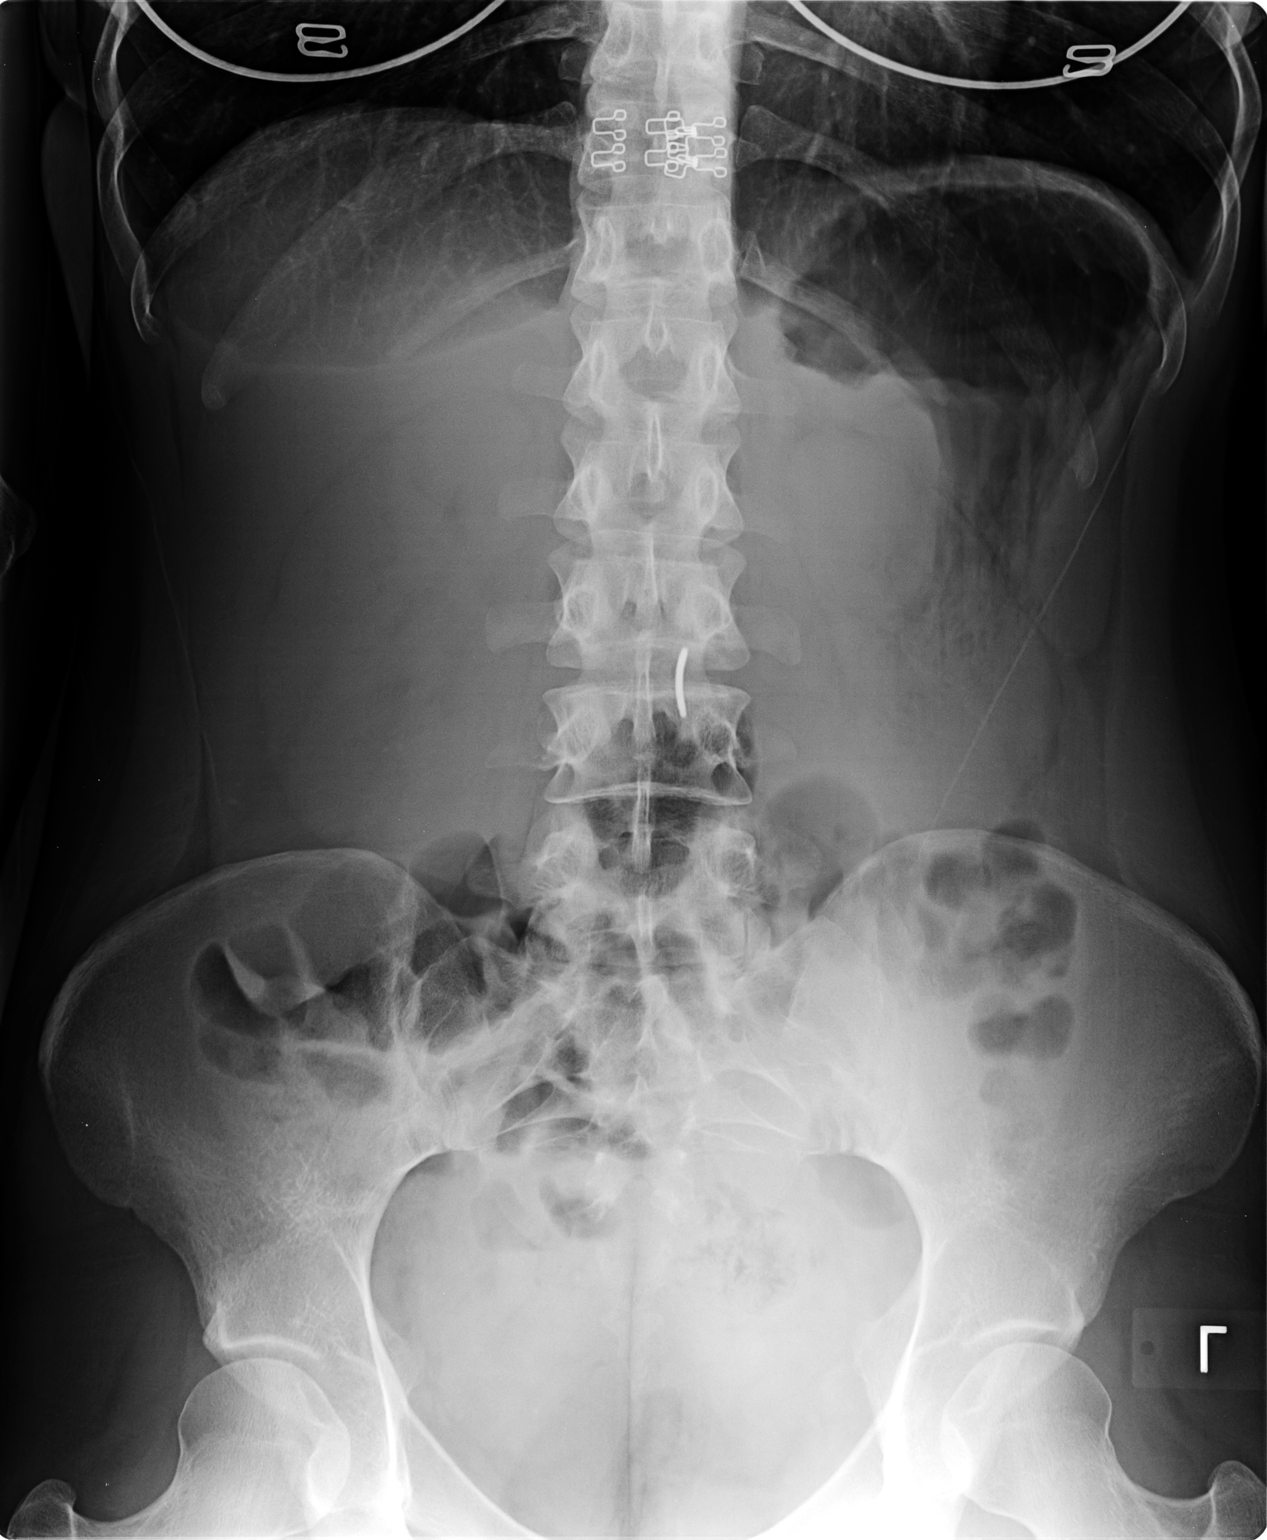

[view not recorded (2 of 2)]
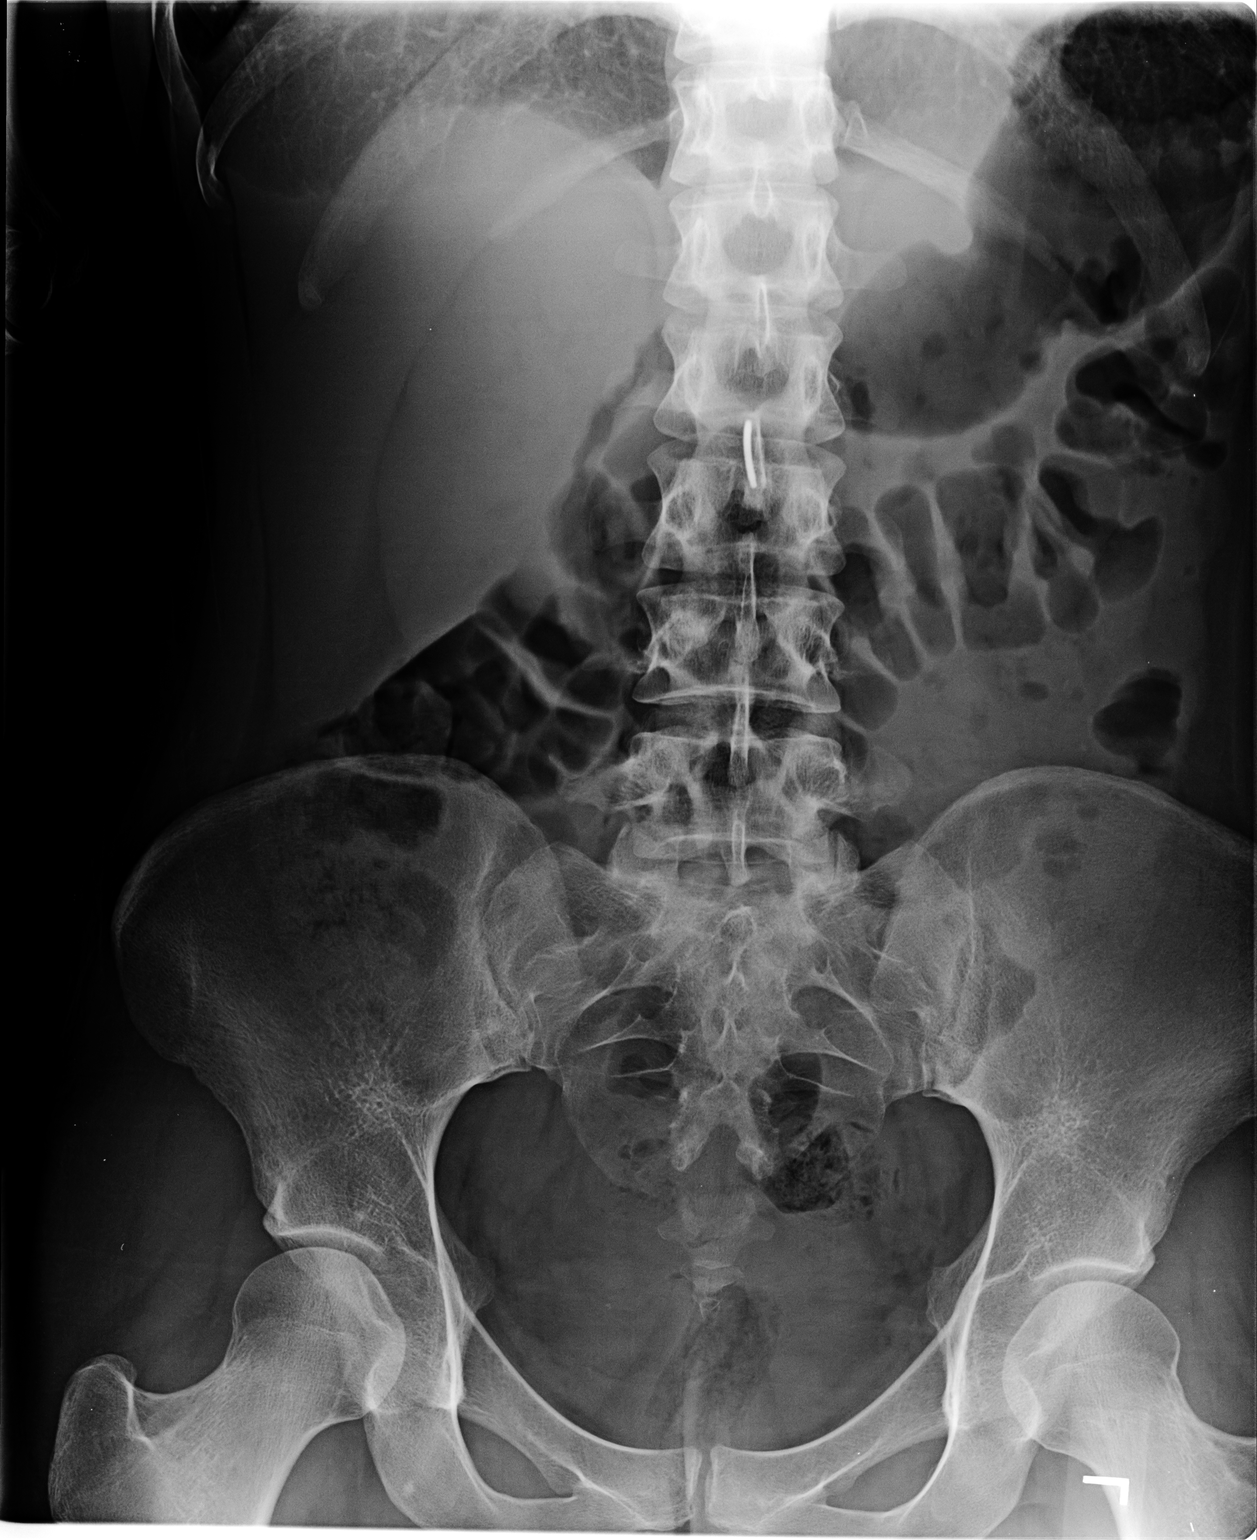

[2 of 2 positions shown; findings below may reference images not displayed]

FINDINGS: Scattered large and small bowel gas is noted. No free air is seen.
No abnormal mass or abnormal calcifications are noted. The bony
structures are within normal limits.
IMPRESSION: No acute abnormality noted.

## 2016-06-04 ENCOUNTER — Encounter: Payer: Self-pay | Admitting: Physician Assistant

## 2016-06-04 ENCOUNTER — Ambulatory Visit (INDEPENDENT_AMBULATORY_CARE_PROVIDER_SITE_OTHER): Payer: Medicaid Other | Admitting: Physician Assistant

## 2016-06-04 VITALS — BP 110/68 | HR 79 | Temp 97.5°F | Ht 65.5 in | Wt 122.4 lb

## 2016-06-04 DIAGNOSIS — N910 Primary amenorrhea: Secondary | ICD-10-CM | POA: Diagnosis not present

## 2016-06-04 DIAGNOSIS — N926 Irregular menstruation, unspecified: Secondary | ICD-10-CM

## 2016-06-04 DIAGNOSIS — Z3A01 Less than 8 weeks gestation of pregnancy: Secondary | ICD-10-CM

## 2016-06-04 LAB — PREGNANCY, URINE: Preg Test, Ur: POSITIVE — AB

## 2016-06-04 NOTE — Progress Notes (Signed)
   BP 110/68   Pulse 79   Temp 97.5 F (36.4 C) (Oral)   Ht 5' 5.5" (1.664 m)   Wt 122 lb 6.4 oz (55.5 kg)   LMP 05/05/2016 (LMP Unknown)   BMI 20.06 kg/m    Subjective:    Patient ID: Meghan GrovesKathy Sue Guerrero, female    DOB: 1991-04-09, 25 y.o.   MRN: [id number]  HPI: Meghan GrovesKathy Sue Guerrero is a 25 y.o. female presenting on 06/04/2016 for confirm pregnancy (last cycle 05/05/2016) Patient's last menstrual cycle was 05/05/2016. She has been trying to get pregnant. She performed to home pregnancy tests that were positive. She needed confirmation for this today. She will need a letter to be able [facility]. Relevant past medical, surgical, family and social history reviewed and updated as indicated. Allergies and medications reviewed and updated.  Past Medical History:  Diagnosis Date  . Migraine     History reviewed. No pertinent surgical history.  Review of Systems  Constitutional: Negative.  Negative for activity change, fatigue and fever.  HENT: Negative.   Eyes: Negative.   Respiratory: Negative.  Negative for cough.   Cardiovascular: Negative.  Negative for chest pain.  Gastrointestinal: Negative.  Negative for abdominal pain.  Endocrine: Negative.   Genitourinary: Negative.  Negative for dysuria.  Musculoskeletal: Negative.   Skin: Negative.   Neurological: Negative.       Medication List    as of 06/04/2016 12:48 PM   You have not been prescribed any medications.        Objective:    BP 110/68   Pulse 79   Temp 97.5 F (36.4 C) (Oral)   Ht 5' 5.5" (1.664 m)   Wt 122 lb 6.4 oz (55.5 kg)   LMP 05/05/2016 (LMP Unknown)   BMI 20.06 kg/m   Allergies  Allergen Reactions  . Ketek [Telithromycin] Hives  . Tramadol Nausea And Vomiting    Physical Exam  Constitutional: She is oriented to person, place, and time. She appears well-developed and well-nourished.  HENT:  Head: Normocephalic and atraumatic.  Eyes: Conjunctivae and EOM are normal. Pupils  are equal, round, and reactive to light.  Cardiovascular: Normal rate, regular rhythm, normal heart sounds and intact distal pulses.   Pulmonary/Chest: Effort normal and breath sounds normal.  Abdominal: Soft. Bowel sounds are normal.  Neurological: She is alert and oriented to person, place, and time. She has normal reflexes.  Skin: Skin is warm and dry. No rash noted.  Psychiatric: She has a normal mood and affect. Her behavior is normal. Judgment and thought content normal.        Assessment & Plan:   1. Irregular periods - Pregnancy, urine  2. Less than [redacted] weeks gestation of pregnancy   Continue all other maintenance medications as listed above.  Follow up plan: Follow as needed  Orders Placed This Encounter  Procedures  . Pregnancy, urine    Educational handout given for prenatal care  Remus LofflerAngel S. Barre Aydelott PA-C [facility] [address]  [place], [place] [address] [phone]   06/04/2016, 12:48 PM

## 2016-06-04 NOTE — Patient Instructions (Signed)

## 2016-08-28 ENCOUNTER — Other Ambulatory Visit: Payer: Self-pay | Admitting: Nurse Practitioner

## 2016-08-28 DIAGNOSIS — N898 Other specified noninflammatory disorders of vagina: Secondary | ICD-10-CM

## 2016-08-28 DIAGNOSIS — L089 Local infection of the skin and subcutaneous tissue, unspecified: Secondary | ICD-10-CM

## 2017-09-22 ENCOUNTER — Encounter: Payer: Self-pay | Admitting: Family Medicine

## 2017-09-22 ENCOUNTER — Ambulatory Visit: Payer: Medicaid Other | Admitting: Family Medicine

## 2017-09-22 VITALS — BP 129/87 | HR 74 | Temp 98.4°F | Ht 65.5 in | Wt 121.4 lb

## 2017-09-22 DIAGNOSIS — F339 Major depressive disorder, recurrent, unspecified: Secondary | ICD-10-CM

## 2017-09-22 MED ORDER — MIRTAZAPINE 15 MG PO TABS: 15.0000 mg | ORAL_TABLET | Freq: Every day | ORAL | 3 refills | Status: DC

## 2017-09-22 NOTE — Progress Notes (Signed)
   HPI  Patient presents today here for depression  Pt has Hx of depression and anxiety  States that she has had anhedonia, and depressed feelings over the alst 6 weeks after her teeth were removed.   Requesting klonopin  She states that she has tried lexapro, celexa, cymbalta, and zoloft which all increase her crying. She state that she is frustrated as "no one will prescribe klonopin unless I start antidepressants" She has not used benzos lately She does not want to see psychiatry Denies and Hx of BPD dx  Denies SI  PMH: Smoking status noted ROS: Per HPI  Objective: BP 129/87   Pulse 74   Temp 98.4 F (36.9 C) (Oral)   Ht 5' 5.5" (1.664 m)   Wt 121 lb 6.4 oz (55.1 kg)   LMP 08/25/2017   BMI 19.89 kg/m  Gen: NAD, alert, cooperative with exam HEENT: NCAT CV: RRR, good S1/S2, no murmur Resp: CTABL, no wheezes, non-labored Ext: No edema, warm Neuro: Alert and oriented, No gross deficits   [facility] 09/22/2017 06/04/2016 11/19/2015 10/18/2015 08/11/2014  Decreased Interest 3 1 2 1  0  Down, Depressed, Hopeless 2 1 1 1  0  PHQ - 2 Score 5 2 3 2  0  Altered sleeping 3 1 3 3  -  Tired, decreased energy 3 1 1 1  -  Change in appetite 1 0 1 1 -  Feeling bad or failure about yourself  2 0 0 1 -  Trouble concentrating 3 0 0 3 -  Moving slowly or fidgety/restless 3 1 1 2  -  Suicidal thoughts 0 0 0 0 -  PHQ-9 Score 20 5 9 13  -       Assessment and plan:  # Depresssion With anxiety Declines benzo but offered psych which she declines Start remeron and RTC in 3-4 weeks.    Meds ordered this encounter  Medications  . mirtazapine (REMERON) 15 MG tablet    Sig: Take 1 tablet (15 mg total) by mouth at bedtime.    Dispense:  30 tablet    Refill:  3    Murtis SinkSam Domonique Cothran, MD [facility] 09/22/2017, 5:27 PM

## 2017-09-29 ENCOUNTER — Telehealth: Payer: Self-pay | Admitting: Family Medicine

## 2017-09-29 NOTE — Telephone Encounter (Signed)
Patient aware and verbalizes understanding. 

## 2017-09-29 NOTE — Telephone Encounter (Signed)
I would recommend reducing to half tab, if still persistent will need to follow up for alternatives.   Murtis SinkSam Bradshaw, MD [facility] 09/29/2017, 12:53 PM

## 2017-09-29 NOTE — Telephone Encounter (Signed)
Pt was seen last week by Ermalinda MemosBradshaw and states that the mirtazapine (REMERON) 15 MG tablet that was prescribed is making her go into a deep sleep to point she doesn't hear her 7 month crying and her husband is having to get up with the baby at night after working all day. She is wanting to know what she is needing to do?? Pt is coming this afternoon to bring her son in for an apt with bradshaw.

## 2017-10-05 ENCOUNTER — Telehealth: Payer: Self-pay | Admitting: Family Medicine

## 2017-10-05 ENCOUNTER — Other Ambulatory Visit: Payer: Self-pay | Admitting: Family Medicine

## 2017-10-05 MED ORDER — OSELTAMIVIR PHOSPHATE 75 MG PO CAPS: 75.0000 mg | ORAL_CAPSULE | Freq: Every day | ORAL | 0 refills | Status: DC

## 2017-10-05 NOTE — Telephone Encounter (Signed)
I sent in the requested prescription 

## 2017-10-05 NOTE — Telephone Encounter (Signed)
Left detailed message.   

## 2017-10-13 ENCOUNTER — Encounter: Payer: Self-pay | Admitting: Nurse Practitioner

## 2017-10-13 ENCOUNTER — Ambulatory Visit: Payer: Medicaid Other | Admitting: Nurse Practitioner

## 2017-10-13 VITALS — BP 127/85 | HR 102 | Temp 97.8°F | Ht 65.0 in | Wt 125.0 lb

## 2017-10-13 DIAGNOSIS — F411 Generalized anxiety disorder: Secondary | ICD-10-CM

## 2017-10-13 MED ORDER — VILAZODONE HCL 10 & 20 MG PO KIT
20.0000 mg | PACK | Freq: Every day | ORAL | 0 refills | Status: DC
Start: 2017-10-13 — End: 2018-01-25

## 2017-10-13 NOTE — Progress Notes (Signed)
   Subjective:    Patient ID: Meghan Guerrero, female    DOB: 04/25/1991, 27 y.o.   MRN: [id number]  HPI Patient comes in today c/o being on edge. She is very moody and snappy with her children. She recently started on remeron which she takes at night. It has helped with crying episodes. She says that she has tried lexapro, celexa, cymbalta and zoloft which all increased her anxiety. She has had klonopin in the past and that is what she wants today. She says if she does not get klonopin she is going to go F___ing crazy. GAD 7 : Generalized Anxiety Score 10/13/2017  Nervous, Anxious, on Edge 3  Control/stop worrying 2  Worry too much - different things 3  Trouble relaxing 3  Restless 3  Easily annoyed or irritable 3  Afraid - awful might happen 0  Total GAD 7 Score 17  Anxiety Difficulty Very difficult       Review of Systems  Cardiovascular: Negative.   Gastrointestinal: Negative.   Psychiatric/Behavioral: The patient is nervous/anxious.        Objective:   Physical Exam  Constitutional: She is oriented to person, place, and time. She appears well-developed and well-nourished. No distress.  Cardiovascular: Normal rate and regular rhythm.  Pulmonary/Chest: Effort normal and breath sounds normal.  Abdominal: Soft.  Neurological: She is alert and oriented to person, place, and time.  Skin: Skin is warm.  Psychiatric: She has a normal mood and affect. Her behavior is normal. Judgment and thought content normal.    BP 127/85   Pulse (!) 102   Temp 97.8 F (36.6 C) (Oral)   Ht '5\' 5"'$  (1.651 m)   Wt 125 lb (56.7 kg)   BMI 20.80 kg/m        Assessment & Plan:   1. GAD (generalized anxiety disorder)    Meds ordered this encounter  Medications  . Vilazodone HCl (VIIBRYD STARTER PACK) 10 & 20 MG KIT    Sig: Take 20 mg by mouth daily.    Dispense:  2 kit    Refill:  0    Order Specific Question:   Supervising Provider    Answer:   Evette Doffing, CAROL L [[id number]]   Refused to  give patient klonopin She agreed to try viibyrd- sample kit was given with instructions on how to take Medication side effects discussed Follow up in 3 weeks.  Mary-Margaret Hassell Done, FNP

## 2017-10-13 NOTE — Patient Instructions (Signed)
Stress and Stress Management Stress is a normal reaction to life events. It is what you feel when life demands more than you are used to or more than you can handle. Some stress can be useful. For example, the stress reaction can help you catch the last bus of the day, study for a test, or meet a deadline at work. But stress that occurs too often or for too long can cause problems. It can affect your emotional health and interfere with relationships and normal daily activities. Too much stress can weaken your immune system and increase your risk for physical illness. If you already have a medical problem, stress can make it worse. What are the causes? All sorts of life events may cause stress. An event that causes stress for one person may not be stressful for another person. Major life events commonly cause stress. These may be positive or negative. Examples include losing your job, moving into a new home, getting married, having a baby, or losing a loved one. Less obvious life events may also cause stress, especially if they occur day after day or in combination. Examples include working long hours, driving in traffic, caring for children, being in debt, or being in a difficult relationship. What are the signs or symptoms? Stress may cause emotional symptoms including, the following:  Anxiety. This is feeling worried, afraid, on edge, overwhelmed, or out of control.  Anger. This is feeling irritated or impatient.  Depression. This is feeling sad, down, helpless, or guilty.  Difficulty focusing, remembering, or making decisions.  Stress may cause physical symptoms, including the following:  Aches and pains. These may affect your head, neck, back, stomach, or other areas of your body.  Tight muscles or clenched jaw.  Low energy or trouble sleeping.  Stress may cause unhealthy behaviors, including the following:  Eating to feel better (overeating) or skipping meals.  Sleeping too little,  too much, or both.  Working too much or putting off tasks (procrastination).  Smoking, drinking alcohol, or using drugs to feel better.  How is this diagnosed? Stress is diagnosed through an assessment by your health care provider. Your health care provider will ask questions about your symptoms and any stressful life events.Your health care provider will also ask about your medical history and may order blood tests or other tests. Certain medical conditions and medicine can cause physical symptoms similar to stress. Mental illness can cause emotional symptoms and unhealthy behaviors similar to stress. Your health care provider may refer you to a mental health professional for further evaluation. How is this treated? Stress management is the recommended treatment for stress.The goals of stress management are reducing stressful life events and coping with stress in healthy ways. Techniques for reducing stressful life events include the following:  Stress identification. Self-monitor for stress and identify what causes stress for you. These skills may help you to avoid some stressful events.  Time management. Set your priorities, keep a calendar of events, and learn to say "no." These tools can help you avoid making too many commitments.  Techniques for coping with stress include the following:  Rethinking the problem. Try to think realistically about stressful events rather than ignoring them or overreacting. Try to find the positives in a stressful situation rather than focusing on the negatives.  Exercise. Physical exercise can release both physical and emotional tension. The key is to find a form of exercise you enjoy and do it regularly.  Relaxation techniques. These relax the body and  mind. Examples include yoga, meditation, tai chi, biofeedback, deep breathing, progressive muscle relaxation, listening to music, being out in nature, journaling, and other hobbies. Again, the key is to find  one or more that you enjoy and can do regularly.  Healthy lifestyle. Eat a balanced diet, get plenty of sleep, and do not smoke. Avoid using alcohol or drugs to relax.  Strong support network. Spend time with family, friends, or other people you enjoy being around.Express your feelings and talk things over with someone you trust.  Counseling or talktherapy with a mental health professional may be helpful if you are having difficulty managing stress on your own. Medicine is typically not recommended for the treatment of stress.Talk to your health care provider if you think you need medicine for symptoms of stress. Follow these instructions at home:  Keep all follow-up visits as directed by your health care provider.  Take all medicines as directed by your health care provider. Contact a health care provider if:  Your symptoms get worse or you start having new symptoms.  You feel overwhelmed by your problems and can no longer manage them on your own. Get help right away if:  You feel like hurting yourself or someone else. This information is not intended to replace advice given to you by your health care provider. Make sure you discuss any questions you have with your health care provider. Document Released: 12/10/2000 Document Revised: 11/22/2015 Document Reviewed: 02/08/2013 Elsevier Interactive Patient Education  2017 Elsevier Inc.  

## 2017-10-15 ENCOUNTER — Other Ambulatory Visit: Payer: Self-pay | Admitting: *Deleted

## 2017-10-15 MED ORDER — MIRTAZAPINE 15 MG PO TABS: 15.0000 mg | ORAL_TABLET | Freq: Every day | ORAL | 0 refills | Status: DC

## 2017-11-02 DIAGNOSIS — H1045 Other chronic allergic conjunctivitis: Secondary | ICD-10-CM | POA: Diagnosis not present

## 2017-11-02 DIAGNOSIS — H52522 Paresis of accommodation, left eye: Secondary | ICD-10-CM | POA: Diagnosis not present

## 2018-01-25 ENCOUNTER — Ambulatory Visit: Payer: Medicaid Other | Admitting: Family Medicine

## 2018-01-25 ENCOUNTER — Encounter: Payer: Self-pay | Admitting: Family Medicine

## 2018-01-25 VITALS — BP 118/84 | HR 80 | Temp 99.0°F | Ht 65.0 in | Wt 125.0 lb

## 2018-01-25 DIAGNOSIS — L819 Disorder of pigmentation, unspecified: Secondary | ICD-10-CM | POA: Diagnosis not present

## 2018-01-25 DIAGNOSIS — B353 Tinea pedis: Secondary | ICD-10-CM

## 2018-01-25 MED ORDER — CLOTRIMAZOLE 1 % EX CREA
1.0000 | TOPICAL_CREAM | Freq: Two times a day (BID) | CUTANEOUS | 1 refills | Status: DC
Start: 2018-01-25 — End: 2018-12-13

## 2018-01-25 NOTE — Patient Instructions (Addendum)
You had labs performed today.  You will be contacted with the results of the labs once they are available, usually in the next 3 business days for routine lab work.   Follow up with Dr Darlyn Read in 4-6 weeks to repeat your blood work and for you to get refills on your medication.  Athlete's Foot Athlete's foot (tinea pedis) is a fungal infection of the skin on the feet. It often occurs on the skin that is between or underneath the toes. It can also occur on the soles of the feet. The infection can spread from person to person (is contagious). What are the causes? Athlete's foot is caused by a fungus. This fungus grows in warm, moist places. Most people get athlete's foot by sharing shower stalls, towels, and wet floors with someone who is infected. Not washing your feet or changing your socks often enough can contribute to athlete's foot. What increases the risk? This condition is more likely to develop in:  Men.  People who have a weak body defense system (immune system).  People who have diabetes.  People who use public showers, such as at a gym.  People who wear heavy-duty shoes, such as Youth worker.  Seasons with warm, humid weather.  What are the signs or symptoms? Symptoms of this condition include:  Itchy areas between the toes or on the soles of the feet.  White, flaky, or scaly areas between the toes or on the soles of the feet.  Very itchy small blisters between the toes or on the soles of the feet.  Small cuts on the skin. These cuts can become infected.  Thick or discolored toenails.  How is this diagnosed? This condition is diagnosed with a medical history and physical exam. Your health care provider may also take a skin or toenail sample to be examined. How is this treated? Treatment for this condition includes antifungal medicines. These may be applied as powders, ointments, or creams. In severe cases, an oral antifungal medicine may be  given. Follow these instructions at home:  Apply or take over-the-counter and prescription medicines only as told by your health care provider.  Keep all follow-up visits as told by your health care provider. This is important.  Do not scratch your feet.  Keep your feet dry: ? Wear cotton or wool socks. Change your socks every day or if they become wet. ? Wear shoes that allow air to circulate, such as sandals or canvas tennis shoes.  Wash and dry your feet: ? Every day or as told by your health care provider. ? After exercising. ? Including the area between your toes.  Do not share towels, nail clippers, or other personal items that touch your feet with others.  If you have diabetes, keep your blood sugar under control. How is this prevented?  Do not share towels.  Wear sandals in wet areas, such as locker rooms and shared showers.  Keep your feet dry: ? Wear cotton or wool socks. Change your socks every day or if they become wet. ? Wear shoes that allow air to circulate, such as sandals or canvas tennis shoes.  Wash and dry your feet after exercising. Pay attention to the area between your toes. Contact a health care provider if:  You have a fever.  You have swelling, soreness, warmth, or redness in your foot.  You are not getting better with treatment.  Your symptoms get worse.  You have new symptoms. This information is not  intended to replace advice given to you by your health care provider. Make sure you discuss any questions you have with your health care provider. Document Released: 06/13/2000 Document Revised: 11/22/2015 Document Reviewed: 12/18/2014 Elsevier Interactive Patient Education  2018 [employer]

## 2018-01-25 NOTE — Progress Notes (Signed)
Subjective: CC: athletes foot PCP: Claretta Fraise, MD PNT:IRWER Meghan Guerrero is a 27 y.o. female presenting to clinic today for:  1. Athlete's foot Patient notes substantial itching of bilateral feet, most predominant at nighttime.  She notes that she is used multiple topical therapies over-the-counter with no relief in symptoms.  Denies any nail involvement.  No skin bleeding or exudate.  2.  Mole on stomach Patient reports a mole on her stomach that seems to have grown after she was pregnant.  She notes that she is afraid to get it caught on clothing.  Denies any spontaneous bleeding.   ROS: Per HPI  Allergies  Allergen Reactions  . Ketek [Telithromycin] Hives  . Tramadol Nausea And Vomiting   Past Medical History:  Diagnosis Date  . Migraine     Current Outpatient Medications:  .  medroxyPROGESTERone Acetate 150 MG/ML SUSY, Inject 150 mg into the muscle., Disp: , Rfl:  .  mirtazapine (REMERON) 15 MG tablet, Take 1 tablet (15 mg total) by mouth at bedtime., Disp: 90 tablet, Rfl: 0 .  Vilazodone HCl (VIIBRYD STARTER PACK) 10 & 20 MG KIT, Take 20 mg by mouth daily., Disp: 2 kit, Rfl: 0 Social History   Socioeconomic History  . Marital status: Single    Spouse name: Not on file  . Number of children: Not on file  . Years of education: Not on file  . Highest education level: Not on file  Occupational History  . Not on file  Social Needs  . Financial resource strain: Not on file  . Food insecurity:    Worry: Not on file    Inability: Not on file  . Transportation needs:    Medical: Not on file    Non-medical: Not on file  Tobacco Use  . Smoking status: Current Some Day Smoker    Packs/day: 0.50    Types: Cigarettes  . Smokeless tobacco: Never Used  Substance and Sexual Activity  . Alcohol use: Yes    Comment: occasional per patient  . Drug use: No  . Sexual activity: Yes  Lifestyle  . Physical activity:    Days per week: Not on file    Minutes per session:  Not on file  . Stress: Not on file  Relationships  . Social connections:    Talks on phone: Not on file    Gets together: Not on file    Attends religious service: Not on file    Active member of club or organization: Not on file    Attends meetings of clubs or organizations: Not on file    Relationship status: Not on file  . Intimate partner violence:    Fear of current or ex partner: Not on file    Emotionally abused: Not on file    Physically abused: Not on file    Forced sexual activity: Not on file  Other Topics Concern  . Not on file  Social History Narrative  . Not on file   No family history on file.  Objective: Office vital signs reviewed. BP 118/84   Pulse 80   Temp 99 F (37.2 C) (Oral)   Ht 5' 5"  (1.651 m)   Wt 125 lb (56.7 kg)   BMI 20.80 kg/m   Physical Examination:  General: Awake, alert, well nourished, No acute distress HEENT: Normal, sclera white, MMM, top row of teeth missing. Extremities: warm, well perfused, No edema, cyanosis or clubbing; +2 pulses bilaterally Skin:  Dry, she has a  dry, slightly macerated skin appreciated along bilateral plantar aspects of feet.  Minimal maceration noted between toes.  No significant nail thickening or discoloration. She has a 1 cm x 0.5 cm pigmented lesion with a small stalk noted.  It has a slightly rough texture.  Color is uniform.  Assessment/ Plan: 27 y.o. female   1. Tinea pedis of both feet Appears to be a mild case.  I have prescribed topical clotrimazole to apply twice daily for the next 4 to 8 weeks.  Keep feet dry.  We discussed possibly considering oral therapies.  Would need to obtain CMP should we decide to pursue these therapies.  Follow-up in the next 4 to 6 weeks for recheck.  2. Pigmented skin lesion of uncertain nature Given reports of concern for growth, I have placed a referral to dermatology for further evaluation of this lesion. - Ambulatory referral to Dermatology  At the end of the  visit, patient started voicing concerns about anxiety.  We discussed that this would need to be addressed at a separate appointment.  It appears that she was evaluated in March and then again in April and instructed to follow-up 3 weeks following the April visit but was lost to follow-up.  We discussed consideration for referral to psychiatry but patient declined this today.  Orders Placed This Encounter  Procedures  . Ambulatory referral to Dermatology    Referral Priority:   Routine    Referral Type:   Consultation    Referral Reason:   Specialty Services Required    Requested Specialty:   Dermatology    Number of Visits Requested:   1   Meds ordered this encounter  Medications  . clotrimazole (LOTRIMIN) 1 % cream    Sig: Apply 1 application topically 2 (two) times daily. x4-8 weeks    Dispense:  60 g    Refill:  [place], DO [place] [phone]

## 2018-03-02 DIAGNOSIS — Z3042 Encounter for surveillance of injectable contraceptive: Secondary | ICD-10-CM | POA: Diagnosis not present

## 2018-09-02 DIAGNOSIS — H1045 Other chronic allergic conjunctivitis: Secondary | ICD-10-CM | POA: Diagnosis not present

## 2018-09-02 DIAGNOSIS — H52522 Paresis of accommodation, left eye: Secondary | ICD-10-CM | POA: Diagnosis not present

## 2018-09-09 DIAGNOSIS — H40033 Anatomical narrow angle, bilateral: Secondary | ICD-10-CM | POA: Diagnosis not present

## 2018-09-09 DIAGNOSIS — H1013 Acute atopic conjunctivitis, bilateral: Secondary | ICD-10-CM | POA: Diagnosis not present

## 2018-09-13 DIAGNOSIS — H1013 Acute atopic conjunctivitis, bilateral: Secondary | ICD-10-CM | POA: Diagnosis not present

## 2018-12-06 ENCOUNTER — Ambulatory Visit (INDEPENDENT_AMBULATORY_CARE_PROVIDER_SITE_OTHER): Payer: Medicaid Other | Admitting: Family Medicine

## 2018-12-06 ENCOUNTER — Encounter: Payer: Self-pay | Admitting: Family Medicine

## 2018-12-06 ENCOUNTER — Other Ambulatory Visit: Payer: Self-pay

## 2018-12-06 DIAGNOSIS — R300 Dysuria: Secondary | ICD-10-CM | POA: Diagnosis not present

## 2018-12-06 DIAGNOSIS — N3090 Cystitis, unspecified without hematuria: Secondary | ICD-10-CM | POA: Diagnosis not present

## 2018-12-06 DIAGNOSIS — B372 Candidiasis of skin and nail: Secondary | ICD-10-CM | POA: Diagnosis not present

## 2018-12-06 MED ORDER — NITROFURANTOIN MONOHYD MACRO 100 MG PO CAPS: 100.0000 mg | ORAL_CAPSULE | Freq: Two times a day (BID) | ORAL | 0 refills | Status: AC

## 2018-12-06 MED ORDER — KETOCONAZOLE 2 % EX CREA: 1.0000 "application " | TOPICAL_CREAM | Freq: Every day | CUTANEOUS | 0 refills | Status: DC

## 2018-12-06 MED ORDER — FLUCONAZOLE 150 MG PO TABS: 150.0000 mg | ORAL_TABLET | Freq: Once | ORAL | 0 refills | Status: AC

## 2018-12-06 NOTE — Progress Notes (Signed)
Virtual Visit via telephone Note Due to COVID-19, visit is conducted virtually and was requested by patient. This visit type was conducted due to national recommendations for restrictions regarding the COVID-19 Pandemic (e.g. social distancing) in an effort to limit this patient's exposure and mitigate transmission in our community. All issues noted in this document were discussed and addressed.  A physical exam was not performed with this format.   I connected with Meghan Guerrero on 12/06/18 at 1420 by telephone and verified that I am speaking with the correct person using two identifiers. Meghan Guerrero is currently located at home and family is currently with them during visit. The provider, Monia Pouch, FNP is located in their office at time of visit.  I discussed the limitations, risks, security and privacy concerns of performing an evaluation and management service by telephone and the availability of in person appointments. I also discussed with the patient that there may be a patient responsible charge related to this service. The patient expressed understanding and agreed to proceed.  Subjective:  Patient ID: Meghan Guerrero, female    DOB: 1991/04/28, 28 y.o.   MRN: [id number]  Chief Complaint:  Rash and Dysuria   HPI: Meghan Guerrero is a 28 y.o. female presenting on 12/06/2018 for Rash and Dysuria   Pt reports dysuria and dark colored urine. States this started 3 days ago. No fever, chills, flank pain, vaginal symptoms, or weakness. She does have lower abdominal pain. Denies pregnancy.  Pt states she has a red, excoriated rash under her breasts. States this started several days ago. States she has been outside daily and sweating a lot. States the rash is tender, pruritic, and burning in nature. No know new contacts or hygiene products. Has tried several over the counter creams and powders without relief.     Relevant past medical, surgical, family, and social history  reviewed and updated as indicated.  Allergies and medications reviewed and updated.   Past Medical History:  Diagnosis Date  . Migraine     Past Surgical History:  Procedure Laterality Date  . DENTAL SURGERY      Social History   Socioeconomic History  . Marital status: Single    Spouse name: Not on file  . Number of children: Not on file  . Years of education: Not on file  . Highest education level: Not on file  Occupational History  . Not on file  Social Needs  . Financial resource strain: Not on file  . Food insecurity:    Worry: Not on file    Inability: Not on file  . Transportation needs:    Medical: Not on file    Non-medical: Not on file  Tobacco Use  . Smoking status: Current Some Day Smoker    Packs/day: 0.50    Types: Cigarettes  . Smokeless tobacco: Never Used  Substance and Sexual Activity  . Alcohol use: Yes    Comment: occasional per patient  . Drug use: No  . Sexual activity: Yes  Lifestyle  . Physical activity:    Days per week: Not on file    Minutes per session: Not on file  . Stress: Not on file  Relationships  . Social connections:    Talks on phone: Not on file    Gets together: Not on file    Attends religious service: Not on file    Active member of club or organization: Not on file    Attends meetings of  clubs or organizations: Not on file    Relationship status: Not on file  . Intimate partner violence:    Fear of current or ex partner: Not on file    Emotionally abused: Not on file    Physically abused: Not on file    Forced sexual activity: Not on file  Other Topics Concern  . Not on file  Social History Narrative  . Not on file    Outpatient Encounter Medications as of 12/06/2018  Medication Sig  . clotrimazole (LOTRIMIN) 1 % cream Apply 1 application topically 2 (two) times daily. x4-8 weeks  . fluconazole (DIFLUCAN) 150 MG tablet Take 1 tablet (150 mg total) by mouth once for 1 dose.  Marland Kitchen. ketoconazole (NIZORAL) 2 % cream  Apply 1 application topically daily.  . medroxyPROGESTERone Acetate 150 MG/ML SUSY Inject 150 mg into the muscle.  . nitrofurantoin, macrocrystal-monohydrate, (MACROBID) 100 MG capsule Take 1 capsule (100 mg total) by mouth 2 (two) times daily for 5 days. 1 po BId   No facility-administered encounter medications on file as of 12/06/2018.     Allergies  Allergen Reactions  . Ketek [Telithromycin] Hives  . Tramadol Nausea And Vomiting    Review of Systems  Constitutional: Negative for chills, diaphoresis, fatigue and fever.  Respiratory: Negative for cough, chest tightness, shortness of breath, wheezing and stridor.   Cardiovascular: Negative for chest pain, palpitations and leg swelling.  Gastrointestinal: Positive for abdominal pain. Negative for abdominal distention, anal bleeding, blood in stool, constipation, diarrhea, nausea, rectal pain and vomiting.  Genitourinary: Positive for dysuria and frequency. Negative for decreased urine volume, difficulty urinating, dyspareunia, enuresis, flank pain, genital sores, hematuria, menstrual problem, pelvic pain, urgency, vaginal bleeding, vaginal discharge and vaginal pain.  Musculoskeletal: Negative for arthralgias and myalgias.  Skin: Positive for color change and rash.  Neurological: Negative for weakness.  Psychiatric/Behavioral: Negative for confusion.  All other systems reviewed and are negative.        Observations/Objective: No vital signs or physical exam, this was a telephone or virtual health encounter.  Pt alert and oriented, answers all questions appropriately, and able to speak in full sentences.    Assessment and Plan: Olegario MessierKathy was seen today for rash and dysuria.  Diagnoses and all orders for this visit:  Yeast dermatitis Reported symptoms consistent with yeast dermatitis. Will treat with below. Keep areas dry. Symptomatic care discussed. Report any new or worsening symptoms.  -     fluconazole (DIFLUCAN) 150 MG tablet;  Take 1 tablet (150 mg total) by mouth once for 1 dose. -     ketoconazole (NIZORAL) 2 % cream; Apply 1 application topically daily.  Dysuria Cystitis Reported symptoms consistent with cystitis. No red flags symptoms or symptoms concerning for pyelonephritis. Will treat with below. Increase water intake and avoid bladder irritants. Report any new or worsening symptoms.  -     nitrofurantoin, macrocrystal-monohydrate, (MACROBID) 100 MG capsule; Take 1 capsule (100 mg total) by mouth 2 (two) times daily for 5 days. 1 po BId     Follow Up Instructions: Return if symptoms worsen or fail to improve.    I discussed the assessment and treatment plan with the patient. The patient was provided an opportunity to ask questions and all were answered. The patient agreed with the plan and demonstrated an understanding of the instructions.   The patient was advised to call back or seek an in-person evaluation if the symptoms worsen or if the condition fails to improve as anticipated.  The above assessment and management plan was discussed with the patient. The patient verbalized understanding of and has agreed to the management plan. Patient is aware to call the clinic if symptoms persist or worsen. Patient is aware when to return to the clinic for a follow-up visit. Patient educated on when it is appropriate to go to the emergency department.    I provided 15 minutes of non-face-to-face time during this encounter. The call started at 1420. The call ended at 1435. The other time was used for coordination of care.    Kari BaarsMichelle Rakes, FNP-C [facility] [address] [place], [place] [address] [phone]

## 2018-12-13 ENCOUNTER — Other Ambulatory Visit: Payer: Self-pay

## 2018-12-13 ENCOUNTER — Encounter: Payer: Self-pay | Admitting: Family

## 2018-12-13 ENCOUNTER — Ambulatory Visit: Payer: Medicaid Other | Admitting: Family

## 2018-12-13 VITALS — BP 122/76 | HR 97 | Temp 98.1°F | Ht 65.0 in | Wt 140.8 lb

## 2018-12-13 DIAGNOSIS — B372 Candidiasis of skin and nail: Secondary | ICD-10-CM | POA: Diagnosis not present

## 2018-12-13 DIAGNOSIS — R300 Dysuria: Secondary | ICD-10-CM

## 2018-12-13 LAB — URINALYSIS, COMPLETE
Bilirubin, UA: NEGATIVE
Glucose, UA: NEGATIVE
Ketones, UA: NEGATIVE
Leukocytes,UA: NEGATIVE
Nitrite, UA: NEGATIVE
Protein,UA: NEGATIVE
RBC, UA: NEGATIVE
Specific Gravity, UA: 1.015 (ref 1.005–1.030)
Urobilinogen, Ur: 0.2 mg/dL (ref 0.2–1.0)
pH, UA: 6 (ref 5.0–7.5)

## 2018-12-13 LAB — MICROSCOPIC EXAMINATION
Bacteria, UA: NONE SEEN
RBC, Urine: NONE SEEN /hpf (ref 0–2)
Renal Epithel, UA: NONE SEEN /hpf

## 2018-12-13 MED ORDER — FLUCONAZOLE 150 MG PO TABS: 150.0000 mg | ORAL_TABLET | Freq: Every day | ORAL | 0 refills | Status: DC

## 2018-12-13 MED ORDER — BUTENAFINE HCL 1 % EX CREA: 1.0000 "application " | TOPICAL_CREAM | Freq: Two times a day (BID) | CUTANEOUS | 2 refills | Status: DC

## 2018-12-13 MED ORDER — KETOCONAZOLE 2 % EX CREA: 1.0000 "application " | TOPICAL_CREAM | Freq: Every day | CUTANEOUS | 2 refills | Status: DC

## 2018-12-13 NOTE — Patient Instructions (Signed)
Intertrigo  Intertrigo is skin irritation or inflammation (dermatitis) that occurs when folds of skin rub together. The irritation can cause a rash and make skin raw and itchy. This condition most commonly occurs in the skin folds of these areas:  · Toes.  · Armpits.  · Groin.  · Under the belly.  · Under the breasts.  · Buttocks.  Intertrigo is not passed from person to person (is not contagious).  What are the causes?  This condition is caused by heat, moisture, rubbing (friction), and not enough air circulation. The condition can be made worse by:  · Sweat.  · Bacteria.  · A fungus, such as yeast.  What increases the risk?  This condition is more likely to occur if you have moisture in your skin folds. You are more likely to develop this condition if you:  · Have diabetes.  · Are overweight.  · Are not able to move around or are not active.  · Live in a warm and moist climate.  · Wear splints, braces, or other medical devices.  · Are not able to control your bowels or bladder (have incontinence).  What are the signs or symptoms?  Symptoms of this condition include:  · A pink or red skin rash in the skin fold or near the skin fold.  · Raw or scaly skin.  · Itchiness.  · A burning feeling.  · Bleeding.  · Leaking fluid.  · A bad smell.  How is this diagnosed?  This condition is diagnosed with a medical history and physical exam. You may also have a skin swab to test for bacteria or a fungus.  How is this treated?  This condition may be treated by:  · Cleaning and drying your skin.  · Taking an antibiotic medicine or using an antibiotic skin cream for a bacterial infection.  · Using an antifungal cream on your skin or taking pills for an infection that was caused by a fungus, such as yeast.  · Using a steroid ointment to relieve itchiness and irritation.  · Separating the skin fold with a clean cotton cloth to absorb moisture and allow air to flow into the area.  Follow these instructions at home:  · Keep the  affected area clean and dry.  · Do not scratch your skin.  · Stay in a cool environment as much as possible. Use an air conditioner or fan, if available.  · Apply over-the-counter and prescription medicines only as told by your health care provider.  · If you were prescribed an antibiotic medicine, use it as told by your health care provider. Do not stop using the antibiotic even if your condition improves.  · Keep all follow-up visits as told by your health care provider. This is important.  How is this prevented?    · Maintain a healthy weight.  · Take care of your feet, especially if you have diabetes. Foot care includes:  ? Wearing shoes that fit well.  ? Keeping your feet dry.  ? Wearing clean, breathable socks.  · Protect the skin around your groin and buttocks, especially if you have incontinence. Skin protection includes:  ? Following a regular cleaning routine.  ? Using skin protectant creams, powders, or ointments.  ? Changing protection pads frequently.  · Do not wear tight clothes. Wear clothes that are loose, absorbent, and made of cotton.  · Wear a bra that gives good support, if needed.  · Shower and dry yourself well   after activity or exercise. Use a hair dryer on a cool setting to dry between skin folds, especially after you bathe.  · If you have diabetes, keep your blood sugar under control.  Contact a health care provider if:  · Your symptoms do not improve with treatment.  · Your symptoms get worse or they spread.  · You notice increased redness and warmth.  · You have a fever.  Summary  · Intertrigo is skin irritation or inflammation (dermatitis) that occurs when folds of skin rub together.  · This condition is caused by heat, moisture, rubbing (friction), and not enough air circulation.  · This condition may be treated by cleaning and drying your skin and with medicines.  · Apply over-the-counter and prescription medicines only as told by your health care provider.  · Keep all follow-up visits  as told by your health care provider. This is important.  This information is not intended to replace advice given to you by your health care provider. Make sure you discuss any questions you have with your health care provider.  Document Released: 06/16/2005 Document Revised: 11/16/2017 Document Reviewed: 11/16/2017  Elsevier Interactive Patient Education © 2019 [employer] Inc[employer]

## 2018-12-13 NOTE — Progress Notes (Signed)
Subjective:    Patient ID: Meghan Guerrero, female    DOB: January 03, 1991, 28 y.o.   MRN: [id number]  Chief Complaint  Patient presents with  . rash under breast   PT presents to the office today with recurrent rash under her bilateral breast. She did a televisit on 12/06/18 she was given Diflucan, Ketoconazole with mild relief, but states she ran out of cream and continues to have the rash.  She states she has  Rash This is a recurrent problem. The current episode started 1 to 4 weeks ago. Location: under breast. The rash is characterized by redness and itchiness. She was exposed to nothing. Pertinent negatives include no congestion, cough, fever, shortness of breath or sore throat. Past treatments include anti-itch cream (diflucan). The treatment provided mild relief.  Dysuria  This is a recurrent problem. The current episode started 1 to 4 weeks ago. The problem occurs intermittently. The pain is mild.       Review of Systems  Constitutional: Negative for fever.  HENT: Negative for congestion and sore throat.   Respiratory: Negative for cough and shortness of breath.   Genitourinary: Positive for dysuria.  Skin: Positive for rash.       Objective:   Physical Exam Vitals signs reviewed.  Constitutional:      General: She is not in acute distress.    Appearance: She is well-developed.  HENT:     Head: Normocephalic and atraumatic.  Neck:     Musculoskeletal: Normal range of motion and neck supple.     Thyroid: No thyromegaly.  Cardiovascular:     Rate and Rhythm: Normal rate and regular rhythm.     Heart sounds: Normal heart sounds. No murmur.  Pulmonary:     Effort: Pulmonary effort is normal. No respiratory distress.     Breath sounds: Normal breath sounds. No wheezing.  Abdominal:     General: Bowel sounds are normal. There is no distension.     Palpations: Abdomen is soft.     Tenderness: There is no abdominal tenderness.  Musculoskeletal: Normal range of motion.         General: No tenderness.  Skin:    General: Skin is warm and dry.     Findings: Rash present.          Comments: Erythemas scaly rash under bilateral breasts  Neurological:     Mental Status: She is alert and oriented to person, place, and time.     Cranial Nerves: No cranial nerve deficit.     Deep Tendon Reflexes: Reflexes are normal and symmetric.  Psychiatric:        Behavior: Behavior normal.        Thought Content: Thought content normal.        Judgment: Judgment normal.       BP 122/76   Pulse 97   Temp 98.1 F (36.7 C) (Oral)   Ht 5\' 5"  (1.651 m)   Wt 140 lb 12.8 oz (63.9 kg)   BMI 23.43 kg/m      Assessment & Plan:  [name] comes in today with chief complaint of rash under breast   Diagnosis and orders addressed:  1. Dysuria - Urinalysis, Complete  2. Yeast dermatitis - Butenafine HCl 1 % cream; Apply 1 application topically 2 (two) times daily.  Dispense: 30 g; Refill: 2 - ketoconazole (NIZORAL) 2 % cream; Apply 1 application topically daily.  Dispense: 60 g; Refill: 2 - fluconazole (DIFLUCAN) 150 MG  tablet; Take 1 tablet (150 mg total) by mouth daily.  Dispense: 7 tablet; Refill: 0   Keep clean and dry Do not scratch RTO if symptoms worsen or do not improve   Jannifer Rodneyhristy Melo Stauber, FNP

## 2019-05-24 ENCOUNTER — Encounter: Payer: Self-pay | Admitting: Family Medicine

## 2019-05-24 ENCOUNTER — Ambulatory Visit (INDEPENDENT_AMBULATORY_CARE_PROVIDER_SITE_OTHER): Payer: Medicaid Other | Admitting: Family Medicine

## 2019-05-24 DIAGNOSIS — R300 Dysuria: Secondary | ICD-10-CM

## 2019-05-24 DIAGNOSIS — R350 Frequency of micturition: Secondary | ICD-10-CM | POA: Diagnosis not present

## 2019-05-24 DIAGNOSIS — L309 Dermatitis, unspecified: Secondary | ICD-10-CM | POA: Diagnosis not present

## 2019-05-24 DIAGNOSIS — R3989 Other symptoms and signs involving the genitourinary system: Secondary | ICD-10-CM | POA: Diagnosis not present

## 2019-05-24 MED ORDER — FLUCONAZOLE 150 MG PO TABS: ORAL_TABLET | ORAL | 0 refills | Status: DC

## 2019-05-24 MED ORDER — NYSTATIN-TRIAMCINOLONE 100000-0.1 UNIT/GM-% EX OINT: 1.0000 "application " | TOPICAL_OINTMENT | Freq: Two times a day (BID) | CUTANEOUS | 0 refills | Status: DC

## 2019-05-24 MED ORDER — NITROFURANTOIN MONOHYD MACRO 100 MG PO CAPS: 100.0000 mg | ORAL_CAPSULE | Freq: Two times a day (BID) | ORAL | 0 refills | Status: AC

## 2019-05-24 NOTE — Progress Notes (Signed)
Virtual Visit via telephone Note Due to COVID-19 pandemic this visit was conducted virtually. This visit type was conducted due to national recommendations for restrictions regarding the COVID-19 Pandemic (e.g. social distancing, sheltering in place) in an effort to limit this patient's exposure and mitigate transmission in our community. All issues noted in this document were discussed and addressed.  A physical exam was not performed with this format.   I connected with Ziza Hastings on 05/24/2019 at 1100 by telephone and verified that I am speaking with the correct person using two identifiers. Maricia Scotti is currently located at home and family is currently with them during visit. The provider, Monia Pouch, FNP is located in their office at time of visit.  I discussed the limitations, risks, security and privacy concerns of performing an evaluation and management service by telephone and the availability of in person appointments. I also discussed with the patient that there may be a patient responsible charge related to this service. The patient expressed understanding and agreed to proceed.  Subjective:  Patient ID: Mykira Hofmeister, female    DOB: 1990/09/05, 28 y.o.   MRN: [id number]  Chief Complaint:  Rash and Dysuria   HPI: Marne Meline is a 28 y.o. female presenting on 05/24/2019 for Rash and Dysuria   Rash This is a recurrent problem. The current episode started more than 1 month ago. The problem has been waxing and waning since onset. Location: under bilateral breasts. The rash is characterized by redness and itchiness (rasied). She was exposed to nothing. Pertinent negatives include no anorexia, congestion, cough, diarrhea, eye pain, facial edema, fatigue, fever, joint pain, nail changes, rhinorrhea, shortness of breath, sore throat or vomiting. Past treatments include anti-itch cream (antifungals). The treatment provided no relief.  Dysuria  This is a new problem.  The current episode started in the past 7 days. The problem occurs every urination. The problem has been gradually worsening. The quality of the pain is described as burning. The pain is at a severity of 4/10. The pain is mild. There has been no fever. She is not sexually active. There is no history of pyelonephritis. Associated symptoms include frequency and urgency. Pertinent negatives include no chills, discharge, flank pain, hematuria, hesitancy, nausea, possible pregnancy, sweats or vomiting. She has tried nothing for the symptoms.     Relevant past medical, surgical, family, and social history reviewed and updated as indicated.  Allergies and medications reviewed and updated.   Past Medical History:  Diagnosis Date  . Migraine     Past Surgical History:  Procedure Laterality Date  . DENTAL SURGERY      Social History   Socioeconomic History  . Marital status: Single    Spouse name: Not on file  . Number of children: Not on file  . Years of education: Not on file  . Highest education level: Not on file  Occupational History  . Not on file  Social Needs  . Financial resource strain: Not on file  . Food insecurity    Worry: Not on file    Inability: Not on file  . Transportation needs    Medical: Not on file    Non-medical: Not on file  Tobacco Use  . Smoking status: Current Some Day Smoker    Packs/day: 0.50    Types: Cigarettes  . Smokeless tobacco: Never Used  Substance and Sexual Activity  . Alcohol use: Yes    Comment: occasional per patient  . Drug  use: No  . Sexual activity: Yes  Lifestyle  . Physical activity    Days per week: Not on file    Minutes per session: Not on file  . Stress: Not on file  Relationships  . Social Musician on phone: Not on file    Gets together: Not on file    Attends religious service: Not on file    Active member of club or organization: Not on file    Attends meetings of clubs or organizations: Not on file     Relationship status: Not on file  . Intimate partner violence    Fear of current or ex partner: Not on file    Emotionally abused: Not on file    Physically abused: Not on file    Forced sexual activity: Not on file  Other Topics Concern  . Not on file  Social History Narrative  . Not on file    Outpatient Encounter Medications as of 05/24/2019  Medication Sig  . Butenafine HCl 1 % cream Apply 1 application topically 2 (two) times daily.  . fluconazole (DIFLUCAN) 150 MG tablet 1 po q week x 4 weeks  . ketoconazole (NIZORAL) 2 % cream Apply 1 application topically daily.  . nitrofurantoin, macrocrystal-monohydrate, (MACROBID) 100 MG capsule Take 1 capsule (100 mg total) by mouth 2 (two) times daily for 5 days. 1 po BId  . nystatin-triamcinolone ointment (MYCOLOG) Apply 1 application topically 2 (two) times daily.  . [DISCONTINUED] fluconazole (DIFLUCAN) 150 MG tablet Take 1 tablet (150 mg total) by mouth daily.   No facility-administered encounter medications on file as of 05/24/2019.     Allergies  Allergen Reactions  . Ketek [Telithromycin] Hives  . Tramadol Nausea And Vomiting    Review of Systems  Constitutional: Negative for activity change, appetite change, chills, diaphoresis, fatigue, fever and unexpected weight change.  HENT: Negative.  Negative for congestion, rhinorrhea and sore throat.   Eyes: Negative.  Negative for photophobia, pain and visual disturbance.  Respiratory: Negative for cough, chest tightness and shortness of breath.   Cardiovascular: Negative for chest pain, palpitations and leg swelling.  Gastrointestinal: Negative for abdominal distention, abdominal pain, anal bleeding, anorexia, blood in stool, constipation, diarrhea, nausea and vomiting.  Endocrine: Negative.  Negative for polydipsia, polyphagia and polyuria.  Genitourinary: Positive for dysuria, frequency and urgency. Negative for decreased urine volume, difficulty urinating, dyspareunia,  enuresis, flank pain, genital sores, hematuria, hesitancy, menstrual problem, pelvic pain, vaginal bleeding, vaginal discharge and vaginal pain.  Musculoskeletal: Negative for arthralgias, joint pain and myalgias.  Skin: Positive for color change and rash. Negative for nail changes.  Allergic/Immunologic: Negative.   Neurological: Negative for dizziness, weakness and headaches.  Hematological: Negative.   Psychiatric/Behavioral: Negative for confusion, hallucinations, sleep disturbance and suicidal ideas.  All other systems reviewed and are negative.        Observations/Objective: No vital signs or physical exam, this was a telephone or virtual health encounter.  Pt alert and oriented, answers all questions appropriately, and able to speak in full sentences.    Assessment and Plan: Ana was seen today for rash and dysuria.  Diagnoses and all orders for this visit:  Dermatitis Reported symptoms consistent with candidal intertrigo. Ongoing for several months. Symptomatic care discussed in detail. Will treat with topical steroid/antifungal cream and oral antifungal. Pt aware to report any new, worsening, or persistent symptoms.  -     nystatin-triamcinolone ointment (MYCOLOG); Apply 1 application topically 2 (two) times  daily. -     fluconazole (DIFLUCAN) 150 MG tablet; 1 po q week x 4 weeks  Dysuria Frequency of micturition Suspected UTI Reported symptoms consistent with UTI. Symptomatic care discussed in detail. Will initiate below. Increase water intake and avoid bladder irritants such as caffeine.  -     nitrofurantoin, macrocrystal-monohydrate, (MACROBID) 100 MG capsule; Take 1 capsule (100 mg total) by mouth 2 (two) times daily for 5 days. 1 po BId     Follow Up Instructions: Return if symptoms worsen or fail to improve.    I discussed the assessment and treatment plan with the patient. The patient was provided an opportunity to ask questions and all were answered. The  patient agreed with the plan and demonstrated an understanding of the instructions.   The patient was advised to call back or seek an in-person evaluation if the symptoms worsen or if the condition fails to improve as anticipated.  The above assessment and management plan was discussed with the patient. The patient verbalized understanding of and has agreed to the management plan. Patient is aware to call the clinic if they develop any new symptoms or if symptoms persist or worsen. Patient is aware when to return to the clinic for a follow-up visit. Patient educated on when it is appropriate to go to the emergency department.    I provided 15 minutes of non-face-to-face time during this encounter. The call started at 1100. The call ended at 1115. The other time was used for coordination of care.    Kari BaarsMichelle Yared Susan, FNP-C [facility] [address] [place], [place] [address] [phone] 05/24/2019

## 2019-10-05 DIAGNOSIS — Z6823 Body mass index (BMI) 23.0-23.9, adult: Secondary | ICD-10-CM | POA: Diagnosis not present

## 2019-10-05 DIAGNOSIS — N946 Dysmenorrhea, unspecified: Secondary | ICD-10-CM | POA: Diagnosis not present

## 2019-10-05 DIAGNOSIS — Z124 Encounter for screening for malignant neoplasm of cervix: Secondary | ICD-10-CM | POA: Diagnosis not present

## 2019-10-05 DIAGNOSIS — Z0000 Encounter for general adult medical examination without abnormal findings: Secondary | ICD-10-CM | POA: Diagnosis not present

## 2019-10-12 DIAGNOSIS — N946 Dysmenorrhea, unspecified: Secondary | ICD-10-CM | POA: Diagnosis not present

## 2019-10-12 DIAGNOSIS — N898 Other specified noninflammatory disorders of vagina: Secondary | ICD-10-CM | POA: Diagnosis not present

## 2019-10-24 DIAGNOSIS — R1031 Right lower quadrant pain: Secondary | ICD-10-CM | POA: Diagnosis not present

## 2019-10-24 DIAGNOSIS — R102 Pelvic and perineal pain: Secondary | ICD-10-CM | POA: Diagnosis not present

## 2019-10-24 DIAGNOSIS — N83201 Unspecified ovarian cyst, right side: Secondary | ICD-10-CM | POA: Diagnosis not present

## 2019-11-17 DIAGNOSIS — D069 Carcinoma in situ of cervix, unspecified: Secondary | ICD-10-CM | POA: Diagnosis not present

## 2019-11-17 DIAGNOSIS — R350 Frequency of micturition: Secondary | ICD-10-CM | POA: Diagnosis not present

## 2019-11-17 DIAGNOSIS — R87612 Low grade squamous intraepithelial lesion on cytologic smear of cervix (LGSIL): Secondary | ICD-10-CM | POA: Diagnosis not present

## 2019-12-12 DIAGNOSIS — N946 Dysmenorrhea, unspecified: Secondary | ICD-10-CM | POA: Diagnosis not present

## 2019-12-12 DIAGNOSIS — D069 Carcinoma in situ of cervix, unspecified: Secondary | ICD-10-CM | POA: Diagnosis not present

## 2019-12-12 DIAGNOSIS — Z01818 Encounter for other preprocedural examination: Secondary | ICD-10-CM | POA: Diagnosis not present

## 2019-12-14 DIAGNOSIS — D069 Carcinoma in situ of cervix, unspecified: Secondary | ICD-10-CM | POA: Diagnosis not present

## 2019-12-14 DIAGNOSIS — N871 Moderate cervical dysplasia: Secondary | ICD-10-CM | POA: Diagnosis not present

## 2019-12-14 DIAGNOSIS — N946 Dysmenorrhea, unspecified: Secondary | ICD-10-CM | POA: Diagnosis not present

## 2019-12-22 DIAGNOSIS — R3989 Other symptoms and signs involving the genitourinary system: Secondary | ICD-10-CM | POA: Diagnosis not present

## 2019-12-22 DIAGNOSIS — G8918 Other acute postprocedural pain: Secondary | ICD-10-CM | POA: Diagnosis not present

## 2019-12-22 DIAGNOSIS — G4700 Insomnia, unspecified: Secondary | ICD-10-CM | POA: Diagnosis not present

## 2019-12-22 DIAGNOSIS — R300 Dysuria: Secondary | ICD-10-CM | POA: Diagnosis not present

## 2019-12-22 DIAGNOSIS — Z09 Encounter for follow-up examination after completed treatment for conditions other than malignant neoplasm: Secondary | ICD-10-CM | POA: Diagnosis not present

## 2019-12-29 DIAGNOSIS — R300 Dysuria: Secondary | ICD-10-CM | POA: Diagnosis not present

## 2020-01-24 DIAGNOSIS — N898 Other specified noninflammatory disorders of vagina: Secondary | ICD-10-CM | POA: Diagnosis not present

## 2020-01-24 DIAGNOSIS — G4700 Insomnia, unspecified: Secondary | ICD-10-CM | POA: Diagnosis not present

## 2020-01-24 DIAGNOSIS — L299 Pruritus, unspecified: Secondary | ICD-10-CM | POA: Diagnosis not present

## 2020-01-24 DIAGNOSIS — F419 Anxiety disorder, unspecified: Secondary | ICD-10-CM | POA: Diagnosis not present

## 2020-03-18 DIAGNOSIS — R0981 Nasal congestion: Secondary | ICD-10-CM | POA: Diagnosis not present

## 2020-03-18 DIAGNOSIS — U071 COVID-19: Secondary | ICD-10-CM | POA: Diagnosis not present

## 2020-05-20 DIAGNOSIS — H5213 Myopia, bilateral: Secondary | ICD-10-CM | POA: Diagnosis not present

## 2020-08-17 DIAGNOSIS — G4700 Insomnia, unspecified: Secondary | ICD-10-CM | POA: Diagnosis not present

## 2020-08-17 DIAGNOSIS — L299 Pruritus, unspecified: Secondary | ICD-10-CM | POA: Diagnosis not present

## 2020-08-17 DIAGNOSIS — F419 Anxiety disorder, unspecified: Secondary | ICD-10-CM | POA: Diagnosis not present

## 2021-06-21 DIAGNOSIS — R109 Unspecified abdominal pain: Secondary | ICD-10-CM | POA: Diagnosis not present

## 2021-06-21 DIAGNOSIS — R509 Fever, unspecified: Secondary | ICD-10-CM | POA: Diagnosis not present

## 2021-06-21 DIAGNOSIS — R0981 Nasal congestion: Secondary | ICD-10-CM | POA: Diagnosis not present

## 2021-06-21 DIAGNOSIS — J329 Chronic sinusitis, unspecified: Secondary | ICD-10-CM | POA: Diagnosis not present

## 2021-06-21 DIAGNOSIS — R059 Cough, unspecified: Secondary | ICD-10-CM | POA: Diagnosis not present

## 2021-06-21 DIAGNOSIS — J029 Acute pharyngitis, unspecified: Secondary | ICD-10-CM | POA: Diagnosis not present

## 2022-01-27 DIAGNOSIS — Z6825 Body mass index (BMI) 25.0-25.9, adult: Secondary | ICD-10-CM | POA: Diagnosis not present

## 2022-01-27 DIAGNOSIS — F419 Anxiety disorder, unspecified: Secondary | ICD-10-CM | POA: Diagnosis not present

## 2022-01-27 DIAGNOSIS — G4700 Insomnia, unspecified: Secondary | ICD-10-CM | POA: Diagnosis not present

## 2022-02-13 ENCOUNTER — Ambulatory Visit (INDEPENDENT_AMBULATORY_CARE_PROVIDER_SITE_OTHER): Payer: BC Managed Care – PPO | Admitting: Nurse Practitioner

## 2022-02-13 ENCOUNTER — Encounter: Payer: Self-pay | Admitting: Nurse Practitioner

## 2022-02-13 VITALS — BP 121/79 | HR 83 | Temp 97.3°F | Ht 65.0 in | Wt 160.6 lb

## 2022-02-13 DIAGNOSIS — R109 Unspecified abdominal pain: Secondary | ICD-10-CM

## 2022-02-13 LAB — URINALYSIS
Bilirubin, UA: NEGATIVE
Glucose, UA: NEGATIVE
Ketones, UA: NEGATIVE
Leukocytes,UA: NEGATIVE
Nitrite, UA: NEGATIVE
Protein,UA: NEGATIVE
RBC, UA: NEGATIVE
Specific Gravity, UA: 1.01 (ref 1.005–1.030)
Urobilinogen, Ur: 0.2 mg/dL (ref 0.2–1.0)
pH, UA: 5.5 (ref 5.0–7.5)

## 2022-02-13 MED ORDER — NAPROXEN 500 MG PO TABS: 500.0000 mg | ORAL_TABLET | Freq: Two times a day (BID) | ORAL | 1 refills | Status: DC

## 2022-02-13 MED ORDER — TIZANIDINE HCL 4 MG PO TABS: 4.0000 mg | ORAL_TABLET | Freq: Four times a day (QID) | ORAL | 0 refills | Status: DC | PRN

## 2022-02-13 NOTE — Patient Instructions (Signed)
Flank Pain, Adult ?Flank pain is pain that is located on the side of the body between the upper abdomen and the spine. This area is called the flank. The pain may occur over a short period of time (acute), or it may be long-term or recurring (chronic). It may be mild or severe. Flank pain can be caused by many things, including: ?Muscle soreness or injury. ?Kidney infection, kidney stones, or kidney disease. ?Stress. ?A disease of the spine (vertebral disk disease). ?A lung infection (pneumonia). ?Fluid around the lungs (pulmonary edema). ?A skin rash caused by the chickenpox virus (shingles). ?Tumors that affect the back of the abdomen. ?Gallbladder disease. ?Follow these instructions at home: ? ?Drink enough fluid to keep your urine pale yellow. ?Rest as told by your health care provider. ?Take over-the-counter and prescription medicines only as told by your health care provider. ?Keep a journal to track what has caused your flank pain and what has made it feel better. ?Keep all follow-up visits. This is important. ?Contact a health care provider if: ?Your pain is not controlled with medicine. ?You have new symptoms. ?Your pain gets worse. ?Your symptoms last longer than 2-3 days. ?You have trouble urinating or you are urinating very frequently. ?Get help right away if: ?You have trouble breathing or you are short of breath. ?Your abdomen hurts or it is swollen or red. ?You have nausea or vomiting. ?You feel faint, or you faint. ?You have blood in your urine. ?You have flank pain and a fever. ?These symptoms may represent a serious problem that is an emergency. Do not wait to see if the symptoms will go away. Get medical help right away. Call your local emergency services (911 in the [place] Do not drive yourself to the hospital. ?Summary ?Flank pain is pain that is located on the side of the body between the upper abdomen and the spine. ?The pain may occur over a short period of time (acute), or it may be  long-term or recurring (chronic). It may be mild or severe. ?Flank pain can be caused by many things. ?Contact your health care provider if your symptoms get worse or last longer than 2-3 days. ?This information is not intended to replace advice given to you by your health care provider. Make sure you discuss any questions you have with your health care provider. ?Document Revised: 08/27/2020 Document Reviewed: 08/27/2020 ?Elsevier Patient Education ? 2023 Elsevier Inc[employer] ? ?

## 2022-02-13 NOTE — Progress Notes (Signed)
Subjective:    Patient ID: Arpi Diebold, female    DOB: 05-04-1991, 31 y.o.   MRN: [id number]   Chief Complaint: Abdominal Pain (Right sided abd pain that started this morning.  When she gets the sharpe pains her pain is a 10.)   Abdominal Pain The onset quality is sudden. The problem occurs constantly. The pain is located in the right flank. The pain is at a severity of 10/10. The pain is severe. The quality of the pain is cramping. Pertinent negatives include no nausea or vomiting. The pain is aggravated by being still. The pain is relieved by Activity and standing. She has tried acetaminophen for the symptoms. The treatment provided mild relief.       Review of Systems  Gastrointestinal:  Positive for abdominal pain. Negative for nausea and vomiting.        Objective:   Physical Exam Vitals and nursing note reviewed.  Constitutional:      General: She is not in acute distress.    Appearance: Normal appearance. She is well-developed.  HENT:     Right Ear: Tympanic membrane normal.     Left Ear: Tympanic membrane normal.     Nose: Nose normal.  Neck:     Vascular: No carotid bruit or JVD.  Pulmonary:     Effort: No respiratory distress.     Breath sounds: No wheezing or rales.  Chest:     Chest wall: No tenderness.  Abdominal:     General: Bowel sounds are normal. There is no distension or abdominal bruit.     Palpations: Abdomen is soft. There is no hepatomegaly, splenomegaly, mass or pulsatile mass.     Tenderness: There is abdominal tenderness. There is no right CVA tenderness, left CVA tenderness, guarding or rebound. Negative signs include Murphy's sign.     Hernia: No hernia is present.  Musculoskeletal:        General: Normal range of motion.     Cervical back: Normal range of motion and neck supple.  Lymphadenopathy:     Cervical: No cervical adenopathy.  Skin:    General: Skin is warm and dry.  Neurological:     Mental Status: She is alert and  oriented to person, place, and time.     Deep Tendon Reflexes: Reflexes are normal and symmetric.  Psychiatric:        Behavior: Behavior normal.        Thought Content: Thought content normal.        Judgment: Judgment normal.     BP 121/79   Pulse 83   Temp (!) 97.3 F (36.3 C) (Temporal)   Ht 5\' 5"  (1.651 m)   Wt 160 lb 9.6 oz (72.8 kg)   SpO2 98%   BMI 26.73 kg/m        Assessment & Plan:  Ekaterini Capitano in today with chief complaint of Abdominal Pain (Right sided abd pain that started this morning.  When she gets the sharpe pains her pain is a 10.)   1. Flank pain Moist heat Rest  RTO prn - Urinalysis - naproxen (NAPROSYN) 500 MG tablet; Take 1 tablet (500 mg total) by mouth 2 (two) times daily with a meal.  Dispense: 60 tablet; Refill: 1 - tiZANidine (ZANAFLEX) 4 MG tablet; Take 1 tablet (4 mg total) by mouth every 6 (six) hours as needed for muscle spasms.  Dispense: 30 tablet; Refill: 0    The above assessment and management plan was  discussed with the patient. The patient verbalized understanding of and has agreed to the management plan. Patient is aware to call the clinic if symptoms persist or worsen. Patient is aware when to return to the clinic for a follow-up visit. Patient educated on when it is appropriate to go to the emergency department.   Mary-Margaret Hassell Done, FNP

## 2022-02-14 ENCOUNTER — Telehealth: Payer: Self-pay | Admitting: Family Medicine

## 2022-02-14 NOTE — Telephone Encounter (Signed)
Please schedule a follow up to reassess pain.

## 2022-02-17 ENCOUNTER — Other Ambulatory Visit: Payer: Self-pay | Admitting: Nurse Practitioner

## 2022-02-17 DIAGNOSIS — R109 Unspecified abdominal pain: Secondary | ICD-10-CM

## 2022-02-17 NOTE — Telephone Encounter (Signed)
Pt saw MMM and wants her to advise on what to do since medicine MMM prescribed her isnt working.

## 2022-02-17 NOTE — Progress Notes (Signed)
Ct scan of abdomen ordered

## 2022-02-17 NOTE — Telephone Encounter (Signed)
Nothing else I can do other then a steroid. Will order CT scan

## 2022-02-17 NOTE — Telephone Encounter (Signed)
Patient is okay with a CT scan being ordered but would like to know if there is anything else that can be called in since Tizanidine has not been helping.

## 2022-02-17 NOTE — Telephone Encounter (Signed)
Will need to do ct scan to see if anything is going on. Is it ok to order

## 2022-02-17 NOTE — Telephone Encounter (Signed)
Patient would like to know if there is anything that can be called in for her muscle spasms until she gets a CT scan. Please call back.

## 2022-02-17 NOTE — Telephone Encounter (Signed)
PATIENT REQUEST STEROID BE SENT TO [facility]

## 2022-02-18 MED ORDER — PREDNISONE 20 MG PO TABS: 40.0000 mg | ORAL_TABLET | Freq: Every day | ORAL | 0 refills | Status: AC

## 2022-02-18 NOTE — Telephone Encounter (Signed)
Steroid sent to pharmacy  Meds ordered this encounter  Medications   predniSONE (DELTASONE) 20 MG tablet    Sig: Take 2 tablets (40 mg total) by mouth daily with breakfast for 5 days. 2 po daily for 5 days    Dispense:  10 tablet    Refill:  0    Order Specific Question:   Supervising Provider    Answer:   Nils Pyle [[id number]]   Mary-Margaret Daphine Deutscher, FNP

## 2022-02-18 NOTE — Addendum Note (Signed)
Addended by: Bennie Pierini on: 02/18/2022 01:28 PM   Modules accepted: Orders

## 2022-02-20 ENCOUNTER — Other Ambulatory Visit: Payer: Self-pay | Admitting: Nurse Practitioner

## 2022-02-20 DIAGNOSIS — R109 Unspecified abdominal pain: Secondary | ICD-10-CM

## 2022-02-20 NOTE — Progress Notes (Signed)
Abdomen

## 2022-03-19 ENCOUNTER — Encounter (HOSPITAL_COMMUNITY): Payer: Self-pay | Admitting: Radiology

## 2022-03-19 ENCOUNTER — Ambulatory Visit (HOSPITAL_COMMUNITY)
Admission: RE | Admit: 2022-03-19 | Discharge: 2022-03-19 | Disposition: A | Discharge status: Home / Self Care | Payer: BC Managed Care – PPO | Source: Ambulatory Visit | Attending: Nurse Practitioner | Admitting: Nurse Practitioner

## 2022-03-19 DIAGNOSIS — R109 Unspecified abdominal pain: Secondary | ICD-10-CM | POA: Diagnosis not present

## 2022-03-20 ENCOUNTER — Telehealth: Payer: Self-pay | Admitting: Family Medicine

## 2022-03-20 NOTE — Telephone Encounter (Signed)
Please call patient back with CT Results ASAP.

## 2022-03-21 NOTE — Telephone Encounter (Signed)
Called and notified patient that CT was normal per report and note form Meghan Guerrero. Patient to know if there is anything else that she needs to do. She states that the pain has improved since taking the prednisone

## 2022-05-01 ENCOUNTER — Encounter: Payer: Self-pay | Admitting: Family Medicine

## 2022-05-01 ENCOUNTER — Ambulatory Visit: Payer: BC Managed Care – PPO | Admitting: Family Medicine

## 2022-05-01 VITALS — BP 125/76 | HR 71 | Temp 97.8°F | Ht 65.0 in | Wt 155.4 lb

## 2022-05-01 DIAGNOSIS — J069 Acute upper respiratory infection, unspecified: Secondary | ICD-10-CM

## 2022-05-01 DIAGNOSIS — H6691 Otitis media, unspecified, right ear: Secondary | ICD-10-CM

## 2022-05-01 MED ORDER — AMOXICILLIN-POT CLAVULANATE 875-125 MG PO TABS: 1.0000 | ORAL_TABLET | Freq: Two times a day (BID) | ORAL | 0 refills | Status: AC

## 2022-05-01 MED ORDER — CHLORPHEN-PE-ACETAMINOPHEN 4-10-325 MG PO TABS: 1.0000 | ORAL_TABLET | Freq: Four times a day (QID) | ORAL | 0 refills | Status: AC | PRN

## 2022-05-01 MED ORDER — BENZONATATE 100 MG PO CAPS: 100.0000 mg | ORAL_CAPSULE | Freq: Three times a day (TID) | ORAL | 0 refills | Status: DC | PRN

## 2022-05-01 NOTE — Patient Instructions (Signed)
Viral Respiratory Infection A respiratory infection is an illness that affects part of the respiratory system, such as the lungs, nose, or throat. A respiratory infection that is caused by a virus is called a viral respiratory infection. Common types of viral respiratory infections include: A cold. The flu (influenza). A respiratory syncytial virus (RSV) infection. What are the causes? This condition is caused by a virus. The virus may spread through contact with droplets or direct contact with infected people or their mucus or secretions. The virus may spread from person to person (is contagious). What are the signs or symptoms? Symptoms of this condition include: A stuffy or runny nose. A sore throat or cough. Shortness of breath or difficulty breathing. Yellow or green mucus (sputum). Other symptoms may include: A fever. Sweating or chills. Fatigue. Achy muscles. A headache. How is this diagnosed? This condition may be diagnosed based on: Your symptoms. A physical exam. Testing of secretions from the nose or throat. Chest X-ray. How is this treated? This condition may be treated with medicines, such as: Antiviral medicine. This may shorten the length of time a person has symptoms. Expectorants. These make it easier to cough up mucus. Decongestant nasal sprays. Acetaminophen or NSAIDs, such as ibuprofen, to relieve fever and pain. Antibiotic medicines are not prescribed for viral infections.This is because antibiotics are designed to kill bacteria. They do not kill viruses. Follow these instructions at home: Managing pain and congestion Take over-the-counter and prescription medicines only as told by your health care provider. If you have a sore throat, gargle with a mixture of salt and water 3-4 times a day or as needed. To make salt water, completely dissolve -1 tsp (3-6 g) of salt in 1 cup (237 mL) of warm water. Use nose drops made from salt water to ease congestion and  soften raw skin around your nose. Take 2 tsp (10 mL) of honey at bedtime to lessen coughing at night. Do not give honey to children who are younger than 1 year. Drink enough fluid to keep your urine pale yellow. This helps prevent dehydration and helps loosen up mucus. General instructions  Rest as much as possible. Do not drink alcohol. Do not use any products that contain nicotine or tobacco. These products include cigarettes, chewing tobacco, and vaping devices, such as e-cigarettes. If you need help quitting, ask your health care provider. Keep all follow-up visits. This is important. How is this prevented?     Get an annual flu shot. You may get the flu shot in late summer, fall, or winter. Ask your health care provider when you should get your flu shot. Avoid spreading your infection to other people. If you are sick: Wash your hands with soap and water often, especially after you cough or sneeze. Wash for at least 20 seconds. If soap and water are not available, use alcohol-based hand sanitizer. Cover your mouth when you cough. Cover your nose and mouth when you sneeze. Do not share cups or eating utensils. Clean commonly used objects often. Clean commonly touched surfaces. Stay home from work or school as told by your health care provider. Avoid contact with people who are sick during cold and flu season. This is generally fall and winter. Contact a health care provider if: Your symptoms last for 10 days or longer. Your symptoms get worse over time. You have severe sinus pain in your face or forehead. The glands in your jaw or neck become very swollen. You have shortness of breath. Get   help right away if you: Feel pain or pressure in your chest. Have trouble breathing. Faint or feel like you will faint. Have severe and persistent vomiting. Feel confused or disoriented. These symptoms may represent a serious problem that is an emergency. Do not wait to see if the symptoms will  go away. Get medical help right away. Call your local emergency services (911 in the [place] Do not drive yourself to the hospital. Summary A respiratory infection is an illness that affects part of the respiratory system, such as the lungs, nose, or throat. A respiratory infection that is caused by a virus is called a viral respiratory infection. Common types of viral respiratory infections include a cold, influenza, and respiratory syncytial virus (RSV) infection. Symptoms of this condition include a stuffy or runny nose, cough, fatigue, achy muscles, sore throat, and fevers or chills. Antibiotic medicines are not prescribed for viral infections. This is because antibiotics are designed to kill bacteria. They are not effective against viruses. This information is not intended to replace advice given to you by your health care provider. Make sure you discuss any questions you have with your health care provider. Document Revised: 09/20/2020 Document Reviewed: 09/20/2020 Elsevier Patient Education  2023 Elsevier Inc.  

## 2022-05-01 NOTE — Progress Notes (Signed)
Acute Office Visit  Subjective:     Patient ID: Meghan Guerrero, female    DOB: [birth year], 31 y.o.   MRN: [id number]  Chief Complaint  Patient presents with   Cough   Facial Pain    Cough This is a new problem. Episode onset: 3 days. The cough is Non-productive. Associated symptoms include chills, ear congestion, ear pain, headaches (around cheeks), nasal congestion, postnasal drip and a sore throat. Pertinent negatives include no chest pain, fever, shortness of breath or wheezing. Exacerbated by: worse at night and in the morning. Risk factors for lung disease include smoking/tobacco exposure. Treatments tried: OTC cold and flu. The treatment provided no relief. There is no history of asthma, bronchitis, COPD or pneumonia.   Her baby was sick with a virus last week  Review of Systems  Constitutional:  Positive for chills. Negative for fever.  HENT:  Positive for ear pain, postnasal drip and sore throat.   Respiratory:  Positive for cough. Negative for shortness of breath and wheezing.   Cardiovascular:  Negative for chest pain.  Neurological:  Positive for headaches (around cheeks).        Objective:    BP 125/76   Pulse 71   Temp 97.8 F (36.6 C) (Temporal)   Ht 5\' 5"  (1.651 m)   Wt 155 lb 6 oz (70.5 kg)   LMP 08/25/2017   SpO2 97%   BMI 25.86 kg/m    Physical Exam Vitals and nursing note reviewed.  Constitutional:      General: She is not in acute distress.    Appearance: She is ill-appearing. She is not toxic-appearing or diaphoretic.  HENT:     Right Ear: Ear canal and external ear normal. Tympanic membrane is erythematous and bulging. Tympanic membrane is not scarred or perforated.     Left Ear: Tympanic membrane, ear canal and external ear normal. Tympanic membrane is not scarred, perforated, erythematous or bulging.     Nose: Congestion present.     Mouth/Throat:     Pharynx: Posterior oropharyngeal erythema present. No pharyngeal swelling or  oropharyngeal exudate.     Tonsils: No tonsillar exudate or tonsillar abscesses. 1+ on the right. 1+ on the left.  Cardiovascular:     Rate and Rhythm: Normal rate and regular rhythm.     Heart sounds: Normal heart sounds. No murmur heard. Pulmonary:     Effort: Pulmonary effort is normal. No respiratory distress.     Breath sounds: Normal breath sounds.  Abdominal:     General: Bowel sounds are normal.     Palpations: Abdomen is soft.  Musculoskeletal:     Cervical back: Neck supple.     Right lower leg: No edema.     Left lower leg: No edema.  Lymphadenopathy:     Cervical: No cervical adenopathy.  Skin:    General: Skin is warm and dry.  Neurological:     General: No focal deficit present.     Mental Status: She is alert and oriented to person, place, and time.  Psychiatric:        Mood and Affect: Mood normal.        Behavior: Behavior normal.     No results found for any visits on 05/01/22.      Assessment & Plan:   Bindu was seen today for cough and facial pain.  Diagnoses and all orders for this visit:  Viral URI with cough Covid test pending, quarantine until results. Norel AD  and tessalon perles prn. Discussed symptomatic care and return precautions.  -     Chlorphen-PE-Acetaminophen 4-10-325 MG TABS; Take 1 tablet by mouth every 6 (six) hours as needed. -     benzonatate (TESSALON PERLES) 100 MG capsule; Take 1 capsule (100 mg total) by mouth 3 (three) times daily as needed for cough. -     COVID-19, Flu A+B and RSV  Acute right otitis media Augmentin as below.  -     amoxicillin-clavulanate (AUGMENTIN) 875-125 MG tablet; Take 1 tablet by mouth 2 (two) times daily for 7 days.   Return if symptoms worsen or fail to improve.  The patient indicates understanding of these issues and agrees with the plan.  Gabriel Earing, FNP

## 2022-05-02 ENCOUNTER — Encounter: Payer: Self-pay | Admitting: Family Medicine

## 2022-05-02 LAB — COVID-19, FLU A+B AND RSV
Influenza A, NAA: NOT DETECTED
Influenza B, NAA: NOT DETECTED
RSV, NAA: NOT DETECTED
SARS-CoV-2, NAA: NOT DETECTED

## 2022-06-12 ENCOUNTER — Ambulatory Visit (INDEPENDENT_AMBULATORY_CARE_PROVIDER_SITE_OTHER): Payer: BC Managed Care – PPO | Admitting: Family Medicine

## 2022-06-12 DIAGNOSIS — R070 Pain in throat: Secondary | ICD-10-CM | POA: Diagnosis not present

## 2022-06-12 DIAGNOSIS — R509 Fever, unspecified: Secondary | ICD-10-CM | POA: Diagnosis not present

## 2022-06-12 DIAGNOSIS — J329 Chronic sinusitis, unspecified: Secondary | ICD-10-CM | POA: Diagnosis not present

## 2022-06-12 DIAGNOSIS — R059 Cough, unspecified: Secondary | ICD-10-CM | POA: Diagnosis not present

## 2022-06-12 NOTE — Progress Notes (Signed)
Tried to call several times to complete telephone visit, no answer. Message left for pt to call the office.   Got in touch with pt and she states she went to Urgent Care this morning.

## 2022-11-06 ENCOUNTER — Ambulatory Visit: Payer: BC Managed Care – PPO | Admitting: Nurse Practitioner

## 2022-11-06 ENCOUNTER — Encounter: Payer: Self-pay | Admitting: Nurse Practitioner

## 2022-11-06 VITALS — BP 123/89 | HR 89 | Temp 98.2°F | Ht 65.0 in | Wt 151.0 lb

## 2022-11-06 DIAGNOSIS — A084 Viral intestinal infection, unspecified: Secondary | ICD-10-CM

## 2022-11-06 MED ORDER — ONDANSETRON HCL 4 MG PO TABS: 4.0000 mg | ORAL_TABLET | Freq: Three times a day (TID) | ORAL | 0 refills | Status: AC | PRN

## 2022-11-06 MED ORDER — ELETRIPTAN HYDROBROMIDE 20 MG PO TABS: 20.0000 mg | ORAL_TABLET | ORAL | 1 refills | Status: AC | PRN

## 2022-11-06 NOTE — Patient Instructions (Signed)

## 2022-11-06 NOTE — Progress Notes (Signed)
   Subjective:    Patient ID: Meghan Guerrero, female    DOB: Oct 29, 1990, 32 y.o.   MRN: [id number]   Chief Complaint: Emesis and Nausea   Emesis  This is a new problem. The current episode started yesterday. The problem occurs more than 10 times per day. The problem has been waxing and waning. The emesis has an appearance of stomach contents. There has been no fever. Pertinent negatives include no chest pain, chills, coughing, diarrhea or headaches. She has tried nothing for the symptoms. The treatment provided mild relief.    Patient Active Problem List   Diagnosis Date Noted   GAD (generalized anxiety disorder) 10/18/2015       Review of Systems  Constitutional:  Negative for chills.  Respiratory:  Negative for cough.   Cardiovascular:  Negative for chest pain.  Gastrointestinal:  Positive for vomiting. Negative for diarrhea.  Neurological:  Negative for headaches.       Objective:   Physical Exam Vitals reviewed.  Constitutional:      Appearance: Normal appearance.  Cardiovascular:     Rate and Rhythm: Normal rate and regular rhythm.     Heart sounds: Normal heart sounds.  Pulmonary:     Breath sounds: Normal breath sounds.  Skin:    General: Skin is warm.  Neurological:     General: No focal deficit present.     Mental Status: She is alert and oriented to person, place, and time.  Psychiatric:        Mood and Affect: Mood normal.        Behavior: Behavior normal.     BP 123/89   Pulse 89   Temp 98.2 F (36.8 C)   Ht 5\' 5"  (1.651 m)   Wt 151 lb (68.5 kg)   LMP 08/25/2017   SpO2 99%   BMI 25.13 kg/m        Assessment & Plan:   Meghan Guerrero in today with chief complaint of Emesis and Nausea   1. Viral gastroenteritis First 24 Hours-Clear liquids  popsicles  Jello  gatorade  Sprite Second 24 hours-Add Full liquids ( Liquids you cant see through) Third 24 hours- Bland diet ( foods that are baked or broiled)  *avoiding fried foods and  highly spiced foods* During these 3 days  Avoid milk, cheese, ice cream or any other dairy products  Avoid caffeine- REMEMBER Mt. Dew and Mello Yellow contain lots of caffeine You should eat and drink in  Frequent small volumes If no improvement in symptoms or worsen in 2-3 days should RETRUN TO OFFICE or go to ER!   Meds ordered this encounter  Medications   ondansetron (ZOFRAN) 4 MG tablet    Sig: Take 1 tablet (4 mg total) by mouth every 8 (eight) hours as needed for nausea or vomiting.    Dispense:  20 tablet    Refill:  0    Order Specific Question:   Supervising Provider    Answer:   Arville Care A [[id number]]       The above assessment and management plan was discussed with the patient. The patient verbalized understanding of and has agreed to the management plan. Patient is aware to call the clinic if symptoms persist or worsen. Patient is aware when to return to the clinic for a follow-up visit. Patient educated on when it is appropriate to go to the emergency department.   Mary-Margaret Daphine Deutscher, FNP
